# Patient Record
Sex: Male | Born: 1980 | Race: White | Hispanic: Yes | Marital: Married | Smoking: Never smoker
Health system: Southern US, Community
[De-identification: ages and names within clinical notes are randomized; demographics above are authoritative.]

## PROBLEM LIST (undated history)

## (undated) DIAGNOSIS — N411 Chronic prostatitis: Secondary | ICD-10-CM

## (undated) DIAGNOSIS — N209 Urinary calculus, unspecified: Secondary | ICD-10-CM

## (undated) HISTORY — PX: NO PAST SURGERIES: SHX2092

## (undated) HISTORY — DX: Urinary calculus, unspecified: N20.9

---

## 2007-02-05 ENCOUNTER — Emergency Department: Payer: Self-pay | Admitting: Emergency Medicine

## 2013-05-02 DIAGNOSIS — N209 Urinary calculus, unspecified: Secondary | ICD-10-CM

## 2013-05-02 HISTORY — DX: Urinary calculus, unspecified: N20.9

## 2015-09-19 ENCOUNTER — Encounter: Payer: Self-pay | Admitting: *Deleted

## 2015-09-20 ENCOUNTER — Encounter: Payer: Self-pay | Admitting: Obstetrics and Gynecology

## 2015-09-20 ENCOUNTER — Ambulatory Visit (INDEPENDENT_AMBULATORY_CARE_PROVIDER_SITE_OTHER): Payer: BLUE CROSS/BLUE SHIELD | Admitting: Obstetrics and Gynecology

## 2015-09-20 VITALS — BP 120/77 | HR 71 | Resp 16 | Ht 67.0 in | Wt 172.6 lb

## 2015-09-20 DIAGNOSIS — Z87442 Personal history of urinary calculi: Secondary | ICD-10-CM | POA: Diagnosis not present

## 2015-09-20 DIAGNOSIS — N200 Calculus of kidney: Secondary | ICD-10-CM | POA: Diagnosis not present

## 2015-09-20 DIAGNOSIS — N390 Urinary tract infection, site not specified: Secondary | ICD-10-CM | POA: Diagnosis not present

## 2015-09-20 LAB — URINALYSIS, COMPLETE
BILIRUBIN UA: NEGATIVE
GLUCOSE, UA: NEGATIVE
Ketones, UA: NEGATIVE
LEUKOCYTES UA: NEGATIVE
Nitrite, UA: NEGATIVE
PROTEIN UA: NEGATIVE
UUROB: 0.2 mg/dL (ref 0.2–1.0)
pH, UA: 6 (ref 5.0–7.5)

## 2015-09-20 LAB — MICROSCOPIC EXAMINATION
BACTERIA UA: NONE SEEN
Renal Epithel, UA: NONE SEEN /hpf

## 2015-09-20 LAB — BLADDER SCAN AMB NON-IMAGING

## 2015-09-20 NOTE — Patient Instructions (Signed)
Recomendaciones dietticas para prevenir la formacin de clculos renales (Dietary Guidelines to Help Prevent Kidney Stones) El riesgo de clculos renales puede disminuir si modifica los alimentos que consume. Lo ms importante es que beba mucho lquido. Debe beber la suficiente cantidad de lquido para mantener la orina clara o de color amarillo plido. Las siguientes recomendaciones brindan informacin especfica para el tipo de clculo renal que usted tiene: RECOMENDACIONES SEGN EL TIPO DE CLCULO RENAL Clculos renales de oxalato de calcio  Reduzca la cantidad de sal que ingiere. Los alimentos con mucha sal hacen que el cuerpo libere exceso de calcio en la orina. El exceso de calcio se puede combinar con una sustancia llamada oxalato y formar clculos renales.  Reduzca la cantidad de protenas animales que consume si lo hace en exceso. Las protenas animales hacen que el cuerpo libere exceso de calcio en la orina. Pregunte a su nutricionista qu cantidad de protenas provenientes de animales debera comer.  Evite los alimentos con alto contenido de oxalatos. Si toma vitaminas, debe consumir menos de 500 mg de vitaminaC. El cuerpo convierte la vitaminaC en oxalatos. No es necesario que evite las frutas y verduras ricas en vitaminaC. Clculos renales de fosfato de calcio  Reduzca la cantidad de sal que ingiere para ayudar a evitar la liberacin de exceso de calcio en la orina.  Reduzca la cantidad de protenas animales que consume si lo hace en exceso. Las protenas animales hacen que el cuerpo libere exceso de calcio en la orina. Pregunte a su nutricionista qu cantidad de protenas provenientes de animales debera comer.  Consuma calcio a travs de los alimentos o tome un suplemento de calcio (pdale recomendaciones a su nutricionista). Ejemplos de fuentes alimenticias de calcio que no aumentan el riesgo de clculos renales:  Brcoli.  Productos lcteos, como queso y  Dentist.  Pudin. Clculos renales de cido rico  No consuma ms de 6 onzas (170 g) de protenas animales por da. FUENTES ALIMENTICIAS Iris Pert de protenas animales  Carne (todo tipo).  Aves.  Huevos.  Pescado, frutos de mar. Alimentos con alto contenido de sal  Condimentos salados.  Salsa de soja.  Salsa teriyaki.  Carnes curadas y procesadas.  Galletas saladas y bocadillos.  Comida rpida.  Sopas enlatadas y Games developer de los alimentos enlatados. Alimentos con alto contenido de oxalatos  Granos:  Corporate investment banker.  Cebada.  Smola.  Germen de trigo.  Salvado.  Harina de trigo sarraceno.  Todos los cereales de salvado.  Pretzels.  Pan integral.  Verduras:  Frijoles (de vaina amarilla).  Remolachas y hojas de Psychiatric nurse.  Vincent Peyer.  Christella Noa.  Escarola.  Puerro.  Calal.  Perejil.  Colinabos.  Espinaca.  Acelga suiza.  Extracto de [place].  Papas fritas.  Batatas.  Nils PyleBenancio Deeds rojas.  Higos.  Kiwi.  Ruibarbo.  Carne y otras fuentes de protenas:  Frijoles (secos).  Hamburguesas de soja y otros productos de soja.  Miso.  Frutos secos (cacahuetes, almendras, pacanas, castaas, avellanas).  Mantequilla de frutos secos.  Semillas de ssamo y tahini (pasta hecha de semillas de ssamo).  Semillas de amapola.  Bebidas:  Polvo para bebidas de chocolate.  Leche de soja.  T helado instantneo.  Jugos hechos de frutas o verduras con alto contenido de oxalato.  Otros:  Algarroba.  Chocolate.  Torta de frutas.  Mermeladas.   Esta informacin no tiene Theme park manager el consejo del mdico. Asegrese de hacerle al mdico cualquier pregunta que tenga.   Document Released: 04/20/2012 Document Revised: 08/01/2013  Chartered certified accountant Patient Education [employer].

## 2015-09-20 NOTE — Progress Notes (Signed)
09/20/2015 8:58 AM   Douglas Baldwin Vanessa Barbara 09/05/1980 [id number]  Referring provider: No referring provider defined for this encounter.  Chief Complaint  Patient presents with  . Nephrolithiasis  . Establish Care    HPI: Patient is a 35 year old Hispanic male presenting today with a Spanish interpreter as a referral from his primary care provider with complaints of recent urinary tract infection and possible prostatitis.   He reports recent of  urinary frequency, dysuria, subjective fever.  Was prescribed Bactrim twice daily x 40 days.  Symptoms now almost completely resolved.  Baseline nocturia once per night.  No new sexual partners.  Married.   Patient reports that he drinks very little fluid at baseline but has significantly increase his fluid intake which has greatly improved his symptoms.  History of stones.  Last episode 2014.  Passed with medical management.   09/10/15 UC +>100,000 E. Coli  PVR 32ml.  FMH: no prostate cancer or other GU malignancies  PMH: Past Medical History  Diagnosis Date  . Calculus (=stone) 05/02/2013    Surgical History: No past surgical history on file.  Home Medications:    Medication List       This list is accurate as of: 09/20/15 11:59 PM.  Always use your most recent med list.               STOOL SOFTENER PO  Take by mouth.     sulfamethoxazole-trimethoprim 800-160 MG tablet  Commonly known as:  BACTRIM DS,SEPTRA DS  Take 1 tablet by mouth 2 (two) times daily.        Allergies: No Known Allergies  Family History: No family history on file.  Social History:  reports that he has never smoked. He does not have any smokeless tobacco history on file. He reports that he does not drink alcohol or use illicit drugs.  ROS: UROLOGY Frequent Urination?: Yes Hard to postpone urination?: Yes Burning/pain with urination?: Yes Get up at night to urinate?: Yes Leakage of urine?: Yes Urine stream starts and stops?: No Trouble  starting stream?: Yes Do you have to strain to urinate?: No Blood in urine?: No Urinary tract infection?: Yes Sexually transmitted disease?: No Injury to kidneys or bladder?: No Painful intercourse?: No Weak stream?: Yes Erection problems?: No Penile pain?: No  Gastrointestinal Nausea?: No Vomiting?: No Indigestion/heartburn?: Yes Diarrhea?: No Constipation?: Yes  Constitutional Fever: No Night sweats?: No Weight loss?: No Fatigue?: Yes  Skin Skin rash/lesions?: No Itching?: No  Eyes Blurred vision?: No Double vision?: No  Ears/Nose/Throat Sore throat?: No Sinus problems?: No  Hematologic/Lymphatic Swollen glands?: No Easy bruising?: No  Cardiovascular Leg swelling?: No Chest pain?: No  Respiratory Cough?: No Shortness of breath?: No  Endocrine Excessive thirst?: No  Musculoskeletal Back pain?: No Joint pain?: No  Neurological Headaches?: No Dizziness?: No  Psychologic Depression?: No Anxiety?: Yes  Physical Exam: BP 120/77 mmHg  Pulse 71  Resp 16  Ht  (1.702 m)  Wt 172 lb 9.6 oz (78.291 kg)  BMI 27.03 kg/m2  Constitutional:  Alert and oriented, No acute distress. HEENT: [place] AT, moist mucus membranes.  Trachea midline, no masses. Cardiovascular: No clubbing, cyanosis, or edema. Respiratory: Normal respiratory effort, no increased work of breathing. GI: Abdomen is soft, nontender, nondistended, no abdominal masses GU: No CVA tenderness.  Uncircumcised phallus, testicles descended bilaterally without palpable masses DRE: small prostate,mildly tender, smooth, not boggy Skin: No rashes, bruises or suspicious lesions. Lymph: No cervical or inguinal adenopathy. Neurologic: Grossly intact, no  focal deficits, moving all 4 extremities. Psychiatric: Normal mood and affect.  Laboratory Data:  No results found for: WBC, HGB, HCT, MCV, PLT  No results found for: CREATININE  No results found for: PSA  No results found for:  TESTOSTERONE  No results found for: HGBA1C  Urinalysis Results for orders placed or performed in visit on 09/20/15  Microscopic Examination  Result Value Ref Range   WBC, UA 6-10 (A) 0 -  5 /hpf   RBC, UA 11-30 (A) 0 -  2 /hpf   Epithelial Cells (non renal) 0-10 0 - 10 /hpf   Renal Epithel, UA None seen None seen /hpf   Mucus, UA Present (A) Not Estab.   Bacteria, UA None seen None seen/Few  Urinalysis, Complete  Result Value Ref Range   Specific Gravity, UA >1.030 (H) 1.005 - 1.030   pH, UA 6.0 5.0 - 7.5   Color, UA Yellow Yellow   Appearance Ur Clear Clear   Leukocytes, UA Negative Negative   Protein, UA Negative Negative/Trace   Glucose, UA Negative Negative   Ketones, UA Negative Negative   RBC, UA 2+ (A) Negative   Bilirubin, UA Negative Negative   Urobilinogen, Ur 0.2 0.2 - 1.0 mg/dL   Nitrite, UA Negative Negative   Microscopic Examination See below:   BLADDER SCAN AMB NON-IMAGING  Result Value Ref Range   Scan Result 32 mL     Pertinent Imaging:   Assessment & Plan:    1. Urinary tract infection- Complete Bactrim as prescribed by PCP and increased fluid intake. Recheck UA/culture in 1 month. - Urinalysis, Complete - BLADDER SCAN AMB NON-IMAGING  2. H/o kidney stones- -RUS and KUB  3. Prostate Cancer Screening- DRE unremarkable. PSA to be drawn at next visit.  Return in about 1 month (around 10/18/2015) for  1 month for recheck symptoms/PSA; KUB/RUS results.  These notes generated with voice recognition software. I apologize for typographical errors.  Earlie Lou, FNP  [facility] [address], [address] [place], [place] [address] [phone]

## 2015-10-03 ENCOUNTER — Ambulatory Visit
Admission: RE | Admit: 2015-10-03 | Discharge: 2015-10-03 | Disposition: A | Discharge status: Home / Self Care | Payer: BLUE CROSS/BLUE SHIELD | Source: Ambulatory Visit | Attending: Obstetrics and Gynecology | Admitting: Obstetrics and Gynecology

## 2015-10-03 DIAGNOSIS — N390 Urinary tract infection, site not specified: Secondary | ICD-10-CM | POA: Diagnosis not present

## 2015-10-18 ENCOUNTER — Ambulatory Visit (INDEPENDENT_AMBULATORY_CARE_PROVIDER_SITE_OTHER): Payer: BLUE CROSS/BLUE SHIELD | Admitting: Obstetrics and Gynecology

## 2015-10-18 ENCOUNTER — Encounter: Payer: Self-pay | Admitting: Obstetrics and Gynecology

## 2015-10-18 VITALS — BP 113/74 | HR 69 | Resp 16 | Ht 67.0 in | Wt 171.8 lb

## 2015-10-18 DIAGNOSIS — N390 Urinary tract infection, site not specified: Secondary | ICD-10-CM | POA: Diagnosis not present

## 2015-10-18 DIAGNOSIS — Z125 Encounter for screening for malignant neoplasm of prostate: Secondary | ICD-10-CM | POA: Diagnosis not present

## 2015-10-18 DIAGNOSIS — R3129 Other microscopic hematuria: Secondary | ICD-10-CM

## 2015-10-18 NOTE — Progress Notes (Signed)
3:46 PM   Douglas Baldwin [facility] May 16, 1981 [id number]  Referring provider: No referring provider defined for this encounter.  Chief Complaint  Patient presents with  . Urinary Tract Infection  . Follow-up    HPI: Patient is a 35 year old Hispanic male presenting today with a Spanish interpreter as a referral from his primary care provider with complaints of recent urinary tract infection and possible prostatitis.   He reports recent of  urinary frequency, dysuria, subjective fever.  Was prescribed Bactrim twice daily x 40 days.  Symptoms now almost completely resolved.  Baseline nocturia once per night.  No new sexual partners.  Married.   Patient reports that he drinks very little fluid at baseline but has significantly increase his fluid intake which has greatly improved his symptoms.  History of stones.  Last episode 2014.  Passed with medical management.   09/10/15 UC +>100,000 E. Coli  PVR 32ml.  FMH: no prostate cancer or other GU malignancies  Interval History 10/18/15:  Presents to review RUS results.   Symptoms much improved.  No urinary symptoms at this time. Never smoker. He is a Education administrator.  PMH: Past Medical History  Diagnosis Date  . Calculus (=stone) 05/02/2013    Surgical History: History reviewed. No pertinent past surgical history.  Home Medications:    Medication List       This list is accurate as of: 10/18/15  3:46 PM.  Always use your most recent med list.               STOOL SOFTENER PO  Take by mouth.     sulfamethoxazole-trimethoprim 800-160 MG tablet  Commonly known as:  BACTRIM DS,SEPTRA DS  Take 1 tablet by mouth 2 (two) times daily.        Allergies: No Known Allergies  Family History: History reviewed. No pertinent family history.  Social History:  reports that he has never smoked. He does not have any smokeless tobacco history on file. He reports that he does not drink alcohol or use illicit  drugs.  ROS: UROLOGY Frequent Urination?: No Hard to postpone urination?: No Burning/pain with urination?: No Get up at night to urinate?: No Leakage of urine?: Yes Urine stream starts and stops?: No Trouble starting stream?: No Do you have to strain to urinate?: No Blood in urine?: No Urinary tract infection?: No Sexually transmitted disease?: No Injury to kidneys or bladder?: No Painful intercourse?: No Weak stream?: No Erection problems?: No Penile pain?: No  Gastrointestinal Nausea?: No Vomiting?: No Indigestion/heartburn?: No Diarrhea?: No Constipation?: No  Constitutional Fever: No Night sweats?: No Weight loss?: No Fatigue?: Yes  Skin Skin rash/lesions?: No Itching?: No  Eyes Blurred vision?: No Double vision?: No  Ears/Nose/Throat Sore throat?: No Sinus problems?: No  Hematologic/Lymphatic Swollen glands?: No Easy bruising?: No  Cardiovascular Leg swelling?: No Chest pain?: No  Respiratory Cough?: No Shortness of breath?: No  Endocrine Excessive thirst?: No  Musculoskeletal Back pain?: No Joint pain?: No  Neurological Headaches?: No Dizziness?: No  Psychologic Depression?: No Anxiety?: No  Physical Exam: BP 113/74 mmHg  Pulse 69  Resp 16  Ht  (1.702 m)  Wt 171 lb 12.8 oz (77.928 kg)  BMI 26.90 kg/m2  Constitutional:  Alert and oriented, No acute distress. HEENT: [place] AT, moist mucus membranes.  Trachea midline, no masses. Cardiovascular: No clubbing, cyanosis, or edema. Respiratory: Normal respiratory effort, no increased work of breathing. GI: Abdomen is soft, nontender, nondistended, no abdominal masses GU: No CVA tenderness.  Uncircumcised  phallus, testicles descended bilaterally without palpable masses DRE: small prostate,mildly tender, smooth, not boggy Skin: No rashes, bruises or suspicious lesions. Lymph: No cervical or inguinal adenopathy. Neurologic: Grossly intact, no focal deficits, moving all 4  extremities. Psychiatric: Normal mood and affect.  Laboratory Data:  No results found for: WBC, HGB, HCT, MCV, PLT  No results found for: CREATININE  No results found for: PSA  No results found for: TESTOSTERONE  No results found for: HGBA1C  Urinalysis Results for orders placed or performed in visit on 09/20/15  Microscopic Examination  Result Value Ref Range   WBC, UA 6-10 (A) 0 -  5 /hpf   RBC, UA 11-30 (A) 0 -  2 /hpf   Epithelial Cells (non renal) 0-10 0 - 10 /hpf   Renal Epithel, UA None seen None seen /hpf   Mucus, UA Present (A) Not Estab.   Bacteria, UA None seen None seen/Few  Urinalysis, Complete  Result Value Ref Range   Specific Gravity, UA >1.030 (H) 1.005 - 1.030   pH, UA 6.0 5.0 - 7.5   Color, UA Yellow Yellow   Appearance Ur Clear Clear   Leukocytes, UA Negative Negative   Protein, UA Negative Negative/Trace   Glucose, UA Negative Negative   Ketones, UA Negative Negative   RBC, UA 2+ (A) Negative   Bilirubin, UA Negative Negative   Urobilinogen, Ur 0.2 0.2 - 1.0 mg/dL   Nitrite, UA Negative Negative   Microscopic Examination See below:   BLADDER SCAN AMB NON-IMAGING  Result Value Ref Range   Scan Result 32 mL     Pertinent Imaging:   Assessment & Plan:    1. Urinary tract infection- Completed Bactrim and urinary symptoms resolved.   - Urinalysis, Complete - BLADDER SCAN AMB NON-IMAGING  2. Microscopic Hematuria- Continued microscopic hematuria on UA today.  We discussed the differential diagnosis for microscopic hematuria including nephrolithiasis, renal or upper tract tumors, bladder stones, UTIs, or bladder tumors as well as undetermined etiologies. Per AUA guidelines, I did recommend complete microscopic hematuria evaluation including CTU, possible urine cytology, and office cystoscopy. -CT Urogram -cystoscopy  3. H/o kidney stones- -RUS negative  4. Prostate Cancer Screening- DRE unremarkable. PSA to be drawn.   Return for Ct  Urogram results and cystoscopy.  These notes generated with voice recognition software. I apologize for typographical errors.  Douglas LouLindsay Jashon Ishida, FNP  [facility] [address], [address] [place], [place] [id number] [phone]

## 2015-10-18 NOTE — Patient Instructions (Signed)
Hematuria - Adultos (Hematuria, Adult) La hematuria es la presencia de sangre en la orina. La causa puede ser una infeccin en la vejiga, en los riones, en la prstata, clculos renales o cncer de las vas urinarias. Normalmente las infecciones pueden tratarse con medicamentos, y los clculos renales, por lo general, se eliminarn a travs de la orina. Si ninguna de estas es la causa de su hematuria, quizs se necesiten ms estudios para Building services engineer. Es muy importante que le informe a su mdico si ve sangre en la [place], aunque el sangrado se detenga sin tratamiento o no cause dolor. La sangre que aparece en la orina y luego se detiene y vuelve a aparecer nuevamente puede ser un sntoma de una enfermedad muy grave. Adems, el dolor no es un sntoma en las etapas iniciales de muchos tipos de cncer urinarios. INSTRUCCIONES PARA EL CUIDADO EN EL HOGAR   Beba mucho lquido, entre 3 a Risk analyst. Si le han diagnosticado una infeccin, se recomienda especialmente el jugo de arndanos rojos, [address] de grandes cantidades de [place].  Evite la cafena, el t y las bebidas con gas porque suelen irritar la vejiga.  Evite el alcohol ya que puede Teacher, adult education.  Tome todos los [employer] se lo haya indicado el mdico.  Si le recetaron antibiticos, asegrese de terminarlos, incluso si comienza a sentirse mejor.  Si le han diagnosticado clculos renales, siga las instrucciones de su mdico con respecto a Ecologist la orina para TEFL teacher clculo.  Vace la vejiga con frecuencia. Evite retener la orina durante largos perodos.  Despus de defecar, las mujeres deben higienizarse desde adelante hacia atrs. Use solo un papel por vez.  Si es [employer], vace la vejiga antes y despus de Management consultant. SOLICITE ATENCIN MDICA SI:  Siente dolor en la espalda.  Tiene fiebre.  Tiene sensacin de Programme researcher, broadcasting/film/video (nuseas) o vmitos.  Los sntomas no mejoran despus  de [address]. Si la condicin empeora, visite al mdico antes de lo previsto. SOLICITE ATENCIN MDICA DE INMEDIATO SI:   Vomita con frecuencia e intensamente y no puede tolerar los medicamentos.  Siente un dolor intenso en la espalda o el abdomen incluso tomando los medicamentos.  Elimina cogulos grandes o sangre con la [place].  Se siente extremadamente dbil, se desmaya o pierde el conocimiento. ASEGRESE DE QUE:   Comprende estas instrucciones.  Controlar su afeccin.  Recibir ayuda de inmediato si no mejora o si empeora.   Esta informacin no tiene Theme park manager el consejo del mdico. Asegrese de hacerle al mdico cualquier pregunta que tenga.   Document Released: 07/27/2005 Document Revised: 08/17/2014 Elsevier Interactive Patient Education 2016 [employer]. Clculos renales (Kidney Stones) Los clculos renales (urolitiasis) son masas slidas que se forman en el interior de los riones. El dolor intenso es causado por el movimiento de la piedra a travs del tracto urinario. Cuando la piedra se mueve, el urter hace un espasmo alrededor de la misma. El clculo generalmente se elimina con la [place].  CAUSAS   Un trastorno que hace que ciertas glndulas del cuello produzcan demasiada hormona paratiroidea (hiperparatiroidismo primario).  Una acumulacin de cristales de cido rico, similar a Brewing technologist.  Estrechamiento (constriccin) del urter.  Obstruccin en el rin presente al nacer (obstruccin congnita).  Cirugas previas del rin o los urteres.  Numerosas infecciones renales. SNTOMAS   Ganas de vomitar (nuseas).  Devolver la comida (vomitar).  Sangre en la orina (hematuria).  Dolor que generalmente se expande (irradia) hacia la ingle.  Ganas de orinar con frecuencia o de manera urgente. DIAGNSTICO   Historia clnica y examen fsico.  Anlisis de sangre y [place].  Tomografa computada.  En algunos casos se realiza un  examen del interior de la vejiga (citoscopa). TRATAMIENTO   Observacin.  Aumentar la ingesta de lquidos.  Litotricia extracorprea con ondas de choque: es un procedimiento no invasivo que [place] ondas de choque para romper los clculos renales.  Ser necesaria la ciruga si tiene dolor muy intenso o la obstruccin persiste. Hay varios procedimientos quirrgicos. La mayora de los procedimientos se realizan con el uso de pequeos instrumentos. Slo es Passenger transport manager pequeas incisiones para acomodar estos instrumentos, por lo tanto el tiempo de recuperacin es mnimo. El tamao, la ubicacin y la composicin qumica de los clculos son variables importantes que determinarn la eleccin correcta de tratamiento para su caso. Comunquese con su mdico para comprender mejor su situacin, de modo que pueda [employer] de lesiones para usted y su rin.  INSTRUCCIONES PARA EL CUIDADO EN EL HOGAR   Beba gran cantidad de lquido para mantener la orina de tono claro o color amarillo plido. Esto ayudar a eliminar las piedras o los fragmentos.  Cuele la orina con el colador que le han provisto. Guarde todas las partculas y piedras para que las vea el profesional que lo asiste. Puede ser tan pequea como un grano de sal. Es muy importante usar el colador cada vez que orine. La recoleccin de piedras permitir al [employer] y Investment banker, operational que efectivamente ha eliminado una piedra. El anlisis de la piedra con frecuencia permitir identificar qu puede hacer para reducir la incidencia de las recurrencias.  Slo tome medicamentos de venta libre o recetados para Primary school teacher, Environmental health practitioner o bajar la fiebre, segn las indicaciones de su mdico.  Oceanographer a todas las visitas de control como se lo haya indicado el mdico. Esto es importante.  Si se lo indica, hgase radiografas. La ausencia de dolor no siempre significa que las piedras se han eliminado. Puede ser que simplemente hayan  dejado de moverse. Si el paso de orina permanece completamente obstruido, puede causar prdida de la funcin renal o simplemente la destruccin del rin. Es su responsabilidad Chartered certified accountant seguimiento y las radiografas. Las ecografas del rin pueden mostrar una obstruccin y el estado del rin. Las ecografas no se asocian con la radiacin y pueden realizarse fcilmente en cuestin de minutos.  Haga cambios en la dieta diaria como se lo haya indicado el mdico. Es posible que le indiquen lo siguiente:  Limitar la cantidad de sal que consume.  Consumir 5 o ms porciones de frutas y Warehouse manager.  Limitar la cantidad de carne, carne de ave, pescado y [employer] consume.  Recoger Lauris Poag de orina durante 24 horas como se lo haya indicado el mdico. Tal vez tenga que recoger otra muestra de orina cada 6 o 12 meses. SOLICITE ATENCIN MDICA SI:  Siente dolor que no responde a los Public affairs consultant. SOLICITE ATENCIN MDICA DE INMEDIATO SI:   No puede controlar el dolor con los medicamentos que le han recetado.  Siente escalofros o fiebre.  La gravedad o la intensidad del dolor aumenta durante 18 horas y no se Chief Executive Officer con los analgsicos.  Presenta un nuevo episodio de dolor abdominal.  Sufre mareos o se desmaya.  No puede orinar.   Esta informacin no tiene Theme park manager el consejo  del mdico. Asegrese de hacerle al mdico cualquier pregunta que tenga.   Document Released: 07/27/2005 Document Revised: 04/17/2015 Elsevier Interactive Patient Education [employer]2016 Elsevier Inc.

## 2015-10-19 LAB — MICROSCOPIC EXAMINATION
BACTERIA UA: NONE SEEN
EPITHELIAL CELLS (NON RENAL): NONE SEEN /HPF (ref 0–10)

## 2015-10-19 LAB — URINALYSIS, COMPLETE
BILIRUBIN UA: NEGATIVE
Glucose, UA: NEGATIVE
Ketones, UA: NEGATIVE
LEUKOCYTES UA: NEGATIVE
Nitrite, UA: NEGATIVE
PH UA: 7 (ref 5.0–7.5)
Protein, UA: NEGATIVE
Specific Gravity, UA: 1.025 (ref 1.005–1.030)
Urobilinogen, Ur: 0.2 mg/dL (ref 0.2–1.0)

## 2015-10-19 LAB — BASIC METABOLIC PANEL
BUN/Creatinine Ratio: 18 (ref 8–19)
BUN: 15 mg/dL (ref 6–20)
CALCIUM: 9.1 mg/dL (ref 8.7–10.2)
CHLORIDE: 100 mmol/L (ref 96–106)
CO2: 20 mmol/L (ref 18–29)
CREATININE: 0.84 mg/dL (ref 0.76–1.27)
GFR calc non Af Amer: 113 mL/min/{1.73_m2} (ref >59)
GFR, EST AFRICAN AMERICAN: 131 mL/min/{1.73_m2} (ref >59)
GLUCOSE: 74 mg/dL (ref 65–99)
Potassium: 4.1 mmol/L (ref 3.5–5.2)
Sodium: 140 mmol/L (ref 134–144)

## 2015-10-19 LAB — PSA: Prostate Specific Ag, Serum: 0.4 ng/mL (ref 0.0–4.0)

## 2015-10-21 ENCOUNTER — Telehealth: Payer: Self-pay

## 2015-10-21 NOTE — Telephone Encounter (Signed)
Spoke with pt in reference to lab results. Pt voiced understanding.  

## 2015-10-21 NOTE — Telephone Encounter (Signed)
-----   Message from Fernanda DrumLindsay C Overton, FNP sent at 10/21/2015  8:33 AM EDT ----- Please notify patient that his PSA was within normal range. His kidney function was also good. Thanks

## 2015-10-23 ENCOUNTER — Encounter: Payer: Self-pay | Admitting: Urology

## 2015-10-23 ENCOUNTER — Encounter: Payer: Self-pay | Admitting: Obstetrics and Gynecology

## 2015-10-23 ENCOUNTER — Ambulatory Visit (INDEPENDENT_AMBULATORY_CARE_PROVIDER_SITE_OTHER): Payer: BLUE CROSS/BLUE SHIELD | Admitting: Urology

## 2015-10-23 VITALS — BP 148/89 | HR 101 | Ht 70.0 in | Wt 175.0 lb

## 2015-10-23 DIAGNOSIS — N411 Chronic prostatitis: Secondary | ICD-10-CM

## 2015-10-23 DIAGNOSIS — R300 Dysuria: Secondary | ICD-10-CM | POA: Diagnosis not present

## 2015-10-23 DIAGNOSIS — Z87442 Personal history of urinary calculi: Secondary | ICD-10-CM | POA: Diagnosis not present

## 2015-10-23 DIAGNOSIS — R3129 Other microscopic hematuria: Secondary | ICD-10-CM

## 2015-10-23 LAB — URINALYSIS, COMPLETE
Bilirubin, UA: NEGATIVE
GLUCOSE, UA: NEGATIVE
Ketones, UA: NEGATIVE
LEUKOCYTES UA: NEGATIVE
Nitrite, UA: NEGATIVE
PROTEIN UA: NEGATIVE
Specific Gravity, UA: <1.005 — ABNORMAL LOW (ref 1.005–1.030)
UUROB: 0.2 mg/dL (ref 0.2–1.0)
pH, UA: 6.5 (ref 5.0–7.5)

## 2015-10-23 LAB — MICROSCOPIC EXAMINATION
BACTERIA UA: NONE SEEN
Epithelial Cells (non renal): NONE SEEN /hpf (ref 0–10)
WBC UA: NONE SEEN /HPF (ref 0–?)

## 2015-10-23 MED ORDER — LIDOCAINE HCL 2 % EX GEL
1.0000 "application " | Freq: Once | CUTANEOUS | Status: AC
  Administered 2015-10-23: 1 via URETHRAL

## 2015-10-23 MED ORDER — OXYBUTYNIN CHLORIDE 5 MG PO TABS: 5.0000 mg | ORAL_TABLET | Freq: Three times a day (TID) | ORAL | Status: DC | PRN

## 2015-10-23 MED ORDER — IBUPROFEN 800 MG PO TABS: 800.0000 mg | ORAL_TABLET | Freq: Three times a day (TID) | ORAL | Status: DC

## 2015-10-23 NOTE — Progress Notes (Signed)
5:21 PM  10/28/2015  Douglas Baldwin 1981/02/15 [id number]  Referring provider: [facility] [address] [place], [place] [address]  Chief Complaint  Patient presents with  . Cysto    HPI: 35 year old Hispanic male presenting today with a Spanish interpreter as a referral from his primary care provider with complaints of recent urinary tract infection/ prostatitis.  He did have a + UCx on 09/10/15 UC +>100,000 E. Coli.  At that time, he had signficant  urinary frequency, dysuria, subjective fever.  He was prescribed Bactrim twice daily x 40 days.    He improved initially and then the symptoms this time including abdominal pain, urinary symptoms.  He felt that his Azo and Bactrim were making things worse so ultimately stopped these medications.    He presented to the emergency room on 10/20/2015 at [facility] with lower abdominal pain, nausea and vomiting x 2 days at which time CT abdomen pelvis without contrast was performed. This revealed no evidence of stones or any acute CT findings.  He was prescribed additional doxycycline  X 21 days.  Again, his pain is improved with this treatment. He is also taking Flomax for his urinary symptoms.  He has also been noted to have microscopic hematuria on several occasions. He returns to the office today for office cystoscopy. Given that he had a recent noncontrast CT scan, I have offered him to proceed to the operating room for cystoscopy, bilateral retrograde pyelogram to complete his workup. Alternatively, we discussed that given his relatively young age and lack of smoking history, there is very low risk for any malignancy or pathology and is collecting system or ureters. As such, we could defer CT urogram. The patient is very anxious and would like to go ahead and have this CT scan for completeness.   PMH: Past Medical History  Diagnosis Date  . Calculus (=stone) 05/02/2013    Surgical History: History reviewed. No pertinent  past surgical history.  Home Medications:    Medication List       This list is accurate as of: 10/23/15 11:59 PM.  Always use your most recent med list.               doxycycline 100 MG capsule  Commonly known as:  VIBRAMYCIN  Take 100 mg by mouth.     ibuprofen 800 MG tablet  Commonly known as:  ADVIL,MOTRIN  Take 1 tablet (800 mg total) by mouth 3 (three) times daily.     oxybutynin 5 MG tablet  Commonly known as:  DITROPAN  Take 1 tablet (5 mg total) by mouth every 8 (eight) hours as needed for bladder spasms. Urgency/ frequency symptoms     STOOL SOFTENER PO  Take by mouth.        Allergies: No Known Allergies  Family History: History reviewed. No pertinent family history.  Social History:  reports that he has never smoked. He does not have any smokeless tobacco history on file. He reports that he does not drink alcohol or use illicit drugs.  Physical Exam: BP 148/89 mmHg  Pulse 101  Ht  (1.778 m)  Wt 175 lb (79.379 kg)  BMI 25.11 kg/m2  Constitutional:  Alert and oriented, No acute distress. HEENT: [place] AT, moist mucus membranes.  Trachea midline, no masses. Cardiovascular: No clubbing, cyanosis, or edema. Respiratory: Normal respiratory effort, no increased work of breathing. GI: Abdomen is soft, nontender, nondistended, no abdominal masses GU: No CVA tenderness.  Uncircumcised phallus, testicles  descended bilaterally without palpable masses Skin: No rashes, bruises or suspicious lesions. Neurologic: Grossly intact, no focal deficits, moving all 4 extremities. Psychiatric: Normal mood and affect.  Laboratory Data:  Lab Results  Component Value Date   CREATININE 0.84 10/18/2015   Component     Latest Ref Rng 10/18/2015  PSA     0.0 - 4.0 ng/mL 0.4    Urinalysis Results for orders placed or performed in visit on 10/23/15  Microscopic Examination  Result Value Ref Range   WBC, UA None seen 0 -  5 /hpf   RBC, UA 0-2 0 -  2 /hpf    Epithelial Cells (non renal) None seen 0 - 10 /hpf   Bacteria, UA None seen None seen/Few  Urinalysis, Complete  Result Value Ref Range   Specific Gravity, UA <1.005 (L) 1.005 - 1.030   pH, UA 6.5 5.0 - 7.5   Color, UA Yellow Yellow   Appearance Ur Clear Clear   Leukocytes, UA Negative Negative   Protein, UA Negative Negative/Trace   Glucose, UA Negative Negative   Ketones, UA Negative Negative   RBC, UA Trace (A) Negative   Bilirubin, UA Negative Negative   Urobilinogen, Ur 0.2 0.2 - 1.0 mg/dL   Nitrite, UA Negative Negative   Microscopic Examination See below:     Pertinent Imaging:  [facility] ( see care everywhere)     Cystoscopy Procedure Note  Patient identification was confirmed, informed consent was obtained, and patient was prepped using Betadine solution.  Lidocaine jelly was administered per urethral meatus.    Preoperative abx where received prior to procedure.     Pre-Procedure: - Inspection reveals a normal caliber ureteral meatus.  Procedure: The flexible cystoscope was introduced without difficulty - No urethral strictures/lesions are present. - Normal prostate  - Normal bladder neck - Bilateral ureteral orifices identified - Bladder mucosa  reveals no ulcers, tumors, or lesions - No bladder stones - No trabeculation  Retroflexion unremarkable   Post-Procedure: - Patient tolerated the procedure well  Assessment & Plan:   1. Microscopic hematuria S/p negative cystoscopy today Post procedure instructions reviewed Patient would like to proceed with CT urogram for completeness, we will call with results - Urinalysis, Complete - lidocaine (XYLOCAINE) 2 % jelly 1 application; Place 1 application into the urethra once.  2. Dysuria Continue doxycycline to complete course Recommend adequate intake of water Continue Flomax Recommend short course of NSAIDs for inflammation  3. Chronic prostatitis As above  4.  History of kidney stones No stones on CT scan   Return for will call with CT urogram results, f/u PRN.   Vanna ScotlandAshley Ladell Bey, MD  [facility] [address], [address] [place], [place] [address] [phone]

## 2015-10-29 ENCOUNTER — Encounter: Payer: Self-pay | Admitting: Urology

## 2015-10-29 ENCOUNTER — Telehealth: Payer: Self-pay

## 2015-10-29 ENCOUNTER — Ambulatory Visit
Admission: RE | Admit: 2015-10-29 | Discharge: 2015-10-29 | Disposition: A | Discharge status: Home / Self Care | Payer: BLUE CROSS/BLUE SHIELD | Source: Ambulatory Visit | Attending: Obstetrics and Gynecology | Admitting: Obstetrics and Gynecology

## 2015-10-29 ENCOUNTER — Ambulatory Visit (INDEPENDENT_AMBULATORY_CARE_PROVIDER_SITE_OTHER): Payer: BLUE CROSS/BLUE SHIELD | Admitting: Urology

## 2015-10-29 VITALS — BP 129/81 | HR 99 | Ht 66.0 in | Wt 171.0 lb

## 2015-10-29 DIAGNOSIS — N390 Urinary tract infection, site not specified: Secondary | ICD-10-CM | POA: Diagnosis not present

## 2015-10-29 DIAGNOSIS — R3129 Other microscopic hematuria: Secondary | ICD-10-CM | POA: Diagnosis present

## 2015-10-29 DIAGNOSIS — N400 Enlarged prostate without lower urinary tract symptoms: Secondary | ICD-10-CM | POA: Diagnosis not present

## 2015-10-29 DIAGNOSIS — N281 Cyst of kidney, acquired: Secondary | ICD-10-CM | POA: Insufficient documentation

## 2015-10-29 DIAGNOSIS — N3289 Other specified disorders of bladder: Secondary | ICD-10-CM

## 2015-10-29 DIAGNOSIS — N411 Chronic prostatitis: Secondary | ICD-10-CM

## 2015-10-29 MED ORDER — IOPAMIDOL (ISOVUE-300) INJECTION 61%
150.0000 mL | Freq: Once | INTRAVENOUS | Status: AC | PRN
  Administered 2015-10-29: 150 mL via INTRAVENOUS

## 2015-10-29 MED ORDER — OXYBUTYNIN CHLORIDE 5 MG PO TABS: 5.0000 mg | ORAL_TABLET | Freq: Three times a day (TID) | ORAL | Status: DC | PRN

## 2015-10-29 NOTE — Telephone Encounter (Signed)
Medication printed instead of being sent to pharmacy. Medication was e-rx today.

## 2015-10-29 NOTE — Progress Notes (Signed)
10/29/2015   CC: f/u CT urogram  HPI: 35 year old male with a history of Escherichia coli urinary tract infection, prostatitis, and persistent microscopic hematuria. He returned back to the office last week for cystoscopy. He underwent CT urogram today which was essentially normal. Due length and cultural barriers, he returns to the office today to review his CT results and person with a translator.  Study Result     CLINICAL DATA: Microscopic hematuria. Recent urinary tract infection and prostatitis treated with antibiotic therapy.  EXAM: CT ABDOMEN AND PELVIS WITHOUT AND WITH CONTRAST  TECHNIQUE: Multidetector CT imaging of the abdomen and pelvis was performed following the standard protocol before and following the bolus administration of intravenous contrast.  CONTRAST: 150mL ISOVUE-300 IOPAMIDOL (ISOVUE-300) INJECTION 61%  COMPARISON: 02/05/2007 unenhanced CT abdomen/ pelvis. 10/03/2015 renal sonogram.  FINDINGS: Lower chest: No significant pulmonary nodules or acute consolidative airspace disease.  Hepatobiliary: Normal liver with no liver mass. Normal gallbladder with no radiopaque cholelithiasis. No biliary ductal dilatation.  Pancreas: Normal, with no mass or duct dilation.  Spleen: Normal size. No mass.  Adrenals/Urinary Tract: Normal adrenals. No renal stones. No hydronephrosis. Normal caliber ureters, with no ureteral stones. No bladder stones. Normal size kidneys. Solitary hypodense subcentimeter renal cortical lesion in the medial interpolar left kidney, too small to characterize. No additional renal cortical lesions. On delayed imaging, there is no urothelial wall thickening and there are no filling defects in the opacified portions of the bilateral collecting systems or ureters. No bladder wall thickening or focal bladder mass .  Stomach/Bowel: Grossly normal stomach. Normal caliber small bowel with no small bowel wall thickening. Normal  appendix. Normal large bowel with no diverticulosis, large bowel wall thickening or pericolonic fat stranding.  Vascular/Lymphatic: Normal caliber abdominal aorta . Patent portal, splenic, hepatic and renal veins. No pathologically enlarged lymph nodes in the abdomen or pelvis.  Reproductive: Mildly enlarged prostate with nonspecific internal prostatic calcifications.  Other: No pneumoperitoneum, ascites or focal fluid collection.  Musculoskeletal: No aggressive appearing focal osseous lesions.  IMPRESSION: 1. No urolithiasis. No evidence of urinary tract obstruction. 2. Solitary subcentimeter hypodense renal cortical lesion in the left kidney, too small to characterize, probably a benign renal cyst. No overtly suspicious renal cortical masses. No urothelial lesions. 3. Mild prostatomegaly.   Electronically Signed  By: Delbert PhenixJason A Poff M.D.  On: 10/29/2015 11:53     A/P:  1. Microscopic hematuria Status post negative cystoscopy, CT urogram. Results reviewed extensively with patient today. All of his questions were answered. No further workup at this time.  2. Chronic prostatitis Complete course of doxycycline, NSAIDs, Flomax as needed. Patient will return as needed for worsening or persistent symptoms.

## 2015-11-05 ENCOUNTER — Other Ambulatory Visit: Payer: BLUE CROSS/BLUE SHIELD | Admitting: Urology

## 2016-05-14 ENCOUNTER — Encounter: Payer: Self-pay | Admitting: Urology

## 2016-05-14 ENCOUNTER — Ambulatory Visit (INDEPENDENT_AMBULATORY_CARE_PROVIDER_SITE_OTHER): Payer: BLUE CROSS/BLUE SHIELD | Admitting: Urology

## 2016-05-14 VITALS — BP 111/75 | HR 71 | Ht 69.0 in | Wt 175.9 lb

## 2016-05-14 DIAGNOSIS — N411 Chronic prostatitis: Secondary | ICD-10-CM

## 2016-05-14 DIAGNOSIS — R3129 Other microscopic hematuria: Secondary | ICD-10-CM | POA: Diagnosis not present

## 2016-05-14 LAB — URINALYSIS, COMPLETE
Bilirubin, UA: NEGATIVE
GLUCOSE, UA: NEGATIVE
Ketones, UA: NEGATIVE
LEUKOCYTES UA: NEGATIVE
NITRITE UA: NEGATIVE
PROTEIN UA: NEGATIVE
SPEC GRAV UA: 1.02 (ref 1.005–1.030)
Urobilinogen, Ur: 0.2 mg/dL (ref 0.2–1.0)
pH, UA: 7 (ref 5.0–7.5)

## 2016-05-14 LAB — MICROSCOPIC EXAMINATION: Bacteria, UA: NONE SEEN

## 2016-05-14 MED ORDER — MIRABEGRON ER 25 MG PO TB24: 25.0000 mg | ORAL_TABLET | Freq: Every day | ORAL | 11 refills | Status: DC

## 2016-05-14 NOTE — Progress Notes (Signed)
05/14/2016 11:18 AM   Douglas Baldwin [birth year] [id number]  Referring provider: [facility] [address] [place], [place] [address]  Chief Complaint  Patient presents with  . Dysuria    x 1.5 week     HPI: The patient is a 35 year old male with a history of Escherichia coli urinary tract infection, prostatitis, and persistent microscopic hematuria.   He underwent a hematuria workup that was negative in March 2017. He has been diagnosed with chronic prostatitis and has had 3 one-month courses of antibiotics over the last 6 or so months. He continues to have intermittent symptoms that are making day-to-day activities difficult. His biggest complaint is severe urgency as well as urinary frequency. He also reports dysuria. Symptoms are slightly better today however the last few weeks she has been miserable. He has been treated with both rounds of doxycycline and ciprofloxacin with no improvement. He feels that he empties his bladder.   PMH: Past Medical History:  Diagnosis Date  . Calculus (=stone) 05/02/2013    Surgical History: No past surgical history on file.  Home Medications:    Medication List       Accurate as of 05/14/16 11:18 AM. Always use your most recent med list.          ibuprofen 800 MG tablet Commonly known as:  ADVIL,MOTRIN Take 1 tablet (800 mg total) by mouth 3 (three) times daily.   mirabegron ER 25 MG Tb24 tablet Commonly known as:  MYRBETRIQ Take 1 tablet (25 mg total) by mouth daily.   naproxen 500 MG 24 hr tablet Commonly known as:  NAPRELAN Take 500 mg by mouth 2 (two) times daily after a meal.   ondansetron 4 MG disintegrating tablet Commonly known as:  ZOFRAN-ODT Take 4 mg by mouth every 8 (eight) hours as needed for nausea or vomiting.   oxybutynin 5 MG tablet Commonly known as:  DITROPAN Take 1 tablet (5 mg total) by mouth every 8 (eight) hours as needed for bladder spasms. Urgency/ frequency symptoms        Allergies: No Known Allergies  Family History: No family history on file.  Social History:  reports that he has never smoked. He does not have any smokeless tobacco history on file. He reports that he does not drink alcohol or use drugs.  ROS: UROLOGY Frequent Urination?: Yes Hard to postpone urination?: Yes Burning/pain with urination?: Yes Get up at night to urinate?: Yes Leakage of urine?: No Urine stream starts and stops?: Yes Trouble starting stream?: Yes Do you have to strain to urinate?: Yes Blood in urine?: No Urinary tract infection?: No Sexually transmitted disease?: No Injury to kidneys or bladder?: No Painful intercourse?: No Weak stream?: No Erection problems?: No Penile pain?: No  Gastrointestinal Nausea?: No Vomiting?: No Indigestion/heartburn?: No Diarrhea?: No Constipation?: No  Constitutional Fever: No Night sweats?: No Weight loss?: No Fatigue?: No  Skin Skin rash/lesions?: No Itching?: No  Eyes Blurred vision?: No Double vision?: No  Ears/Nose/Throat Sore throat?: No Sinus problems?: No  Hematologic/Lymphatic Swollen glands?: No Easy bruising?: No  Cardiovascular Leg swelling?: No Chest pain?: No  Respiratory Cough?: No Shortness of breath?: No  Endocrine Excessive thirst?: No  Musculoskeletal Back pain?: No Joint pain?: No  Neurological Headaches?: No Dizziness?: No  Psychologic Depression?: No Anxiety?: No  Physical Exam: BP 111/75   Pulse 71   Ht 5\' 9"  (1.753 m)   Wt 175 lb 14.4 oz (79.8 kg)   BMI 25.98 kg/m  Constitutional:  Alert and oriented, No acute distress. HEENT: [place] AT, moist mucus membranes.  Trachea midline, no masses. Cardiovascular: No clubbing, cyanosis, or edema. Respiratory: Normal respiratory effort, no increased work of breathing. GI: Abdomen is soft, nontender, nondistended, no abdominal masses GU: No CVA tenderness. Normal phallus. Testicles descended bilaterally nontender to  palpation. DRE 1+ benign. No bogginess. Skin: No rashes, bruises or suspicious lesions. Lymph: No cervical or inguinal adenopathy. Neurologic: Grossly intact, no focal deficits, moving all 4 extremities. Psychiatric: Normal mood and affect.  Laboratory Data: No results found for: WBC, HGB, HCT, MCV, PLT  Lab Results  Component Value Date   CREATININE 0.84 10/18/2015    No results found for: PSA  No results found for: TESTOSTERONE  No results found for: HGBA1C  Urinalysis    Component Value Date/Time   APPEARANCEUR Clear 10/23/2015 1402   GLUCOSEU Negative 10/23/2015 1402   BILIRUBINUR Negative 10/23/2015 1402   PROTEINUR Negative 10/23/2015 1402   NITRITE Negative 10/23/2015 1402   LEUKOCYTESUR Negative 10/23/2015 1402     Assessment & Plan:   1. Microscopic hematuria -negative work up in March 2017  2. Chronic prostatitis The patient is on 3 courses of antibiotics with no improvement in symptoms. We will start him on a trial of Myrbetriq 25 mg daily.  He will also be referred to pelvic floor physical therapy as a Jamesetta Sohyllis will be very beneficial to him. He was also instructed to take ibuprofen 600 mg 3 times a day for a month to help with inflammation. We will see him back in 3 months after his had a chance to try the new medications and undergo pelvic floor physical therapy.  Return in about 3 months (around 08/14/2016).  Hildred LaserBrian James Zena Vitelli, MD  [facility] [address], [address] [place], [place] [address] [phone]

## 2016-07-17 ENCOUNTER — Ambulatory Visit: Payer: BLUE CROSS/BLUE SHIELD | Attending: Urology | Admitting: Physical Therapy

## 2016-07-17 DIAGNOSIS — R278 Other lack of coordination: Secondary | ICD-10-CM | POA: Insufficient documentation

## 2016-07-17 DIAGNOSIS — M6281 Muscle weakness (generalized): Secondary | ICD-10-CM | POA: Insufficient documentation

## 2016-07-17 NOTE — Therapy (Addendum)
[facility] [address], [place], [address] Phone: [phone]   Fax:  [phone]  Physical Therapy Evaluation  Patient Details  Name: Douglas Baldwin MRN: [id number] Date of Birth: 11-03-1980 Referring Provider: Dr. Sherryl BartersBudzyn  Encounter Date: 07/17/2016      PT End of Session - 07/26/16 1954    Visit Number 1   Number of Visits 12   Date for PT Re-Evaluation 10/02/16   PT Start Time 0810   PT Stop Time 0930   PT Time Calculation (min) 80 min   Activity Tolerance Patient tolerated treatment well;No increased pain      Past Medical History:  Diagnosis Date  . Calculus (=stone) 05/02/2013    No past surgical history on file.  There were no vitals filed for this visit.       Subjective Assessment - 07/26/16 2031    Subjective 1) Pt reports he has had pain in the bladder area for the past 9 months. There are days where I have little pain and then a lot of pain, burning of urination , and urgency (every 30 min) .  Pain level is 8-9/10 across 4/7 days of the week.  Pain is temporary relieved for one day  with Tylenol. 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 climbing a ladder 30-40 x /day.  Pt has worked this job for 4 months. Prior to this position, pt has been performing heavy lifting on his job.    2) Frequent trips to the toilet due to incomplete emptying of bowels and urination and difficulty with initiation.      Patient is accompained by: Interpreter   Pertinent History Physical activity: walking 2 x/ week 30-45 min.     Patient Stated Goals Decrease the pelvic pain and improve urination and bowel movements             [facility] - 07/26/16 1951      Assessment   Medical Diagnosis chronic  prostatitis    Referring Provider Dr. Sherryl BartersBudzyn     Precautions   Precautions None     Restrictions   Weight Bearing Restrictions No     Balance Screen   Has the patient fallen in the past 6 months No     Prior Function   Level of Independence Independent     Observation/Other Assessments   Observations slumped posture, sacral sitting     Coordination   Gross Motor Movements are Fluid and Coordinated --  limited diaphragmatic breathing   Fine Motor Movements are Fluid and Coordinated --  abdominal straining with cue for BM, lifting     Other:   Other/ Comments lifting: with feet together, poor alignment      PROM   Overall PROM Comments hip IR in supine L ~15 deg pre Tx, ~post Tx ~ 30 deg      Palpation   Spinal mobility increased paraspinal mm    Palpation comment increased mm tensions at L: obt int //coccygeus. pre Tx: tenderness at L medial aspect of iliac crest/ pubic, post Tx: less tenderenss                   Pelvic Floor Special Questions - 07/26/16 1953    Diastasis Recti 3 fingers above umbilicus  PT Long Term Goals - 07/26/16 1958      PT LONG TERM GOAL #1   Title Pt will report decreased frequency to urinate from every 30 min to once every 2 hours in order to return to ADLs, work and home activities   Time 12   Period Weeks   Status New     PT LONG TERM GOAL #2   Title Pt will report decreased burning with urination by 50% across 2 weeks in order to improve QOL   Time 12   Period Weeks   Status New     PT LONG TERM GOAL #3   Title Pt will report no pain at the belt level of his pants when he sits in order to sit comfortably   Time 12   Period Weeks   Status New     PT LONG TERM GOAL #4   Title Pt will demo proper lifting mechanics with deep core co-activation when lifting 25, 40 lb crate x 3 reps in order to minimize relapse of injury.    Time 12   Period Weeks   Status New     PT LONG TERM GOAL  #5   Title Pt will demo <1 fingers width separation of his abdomnal mm and no mm tensions/ tenderness across 2 visits in order to decrease pain and improve urinary function   Time 12   Period Weeks   Status New               Plan - 07/26/16 1954    Clinical Impression Statement Pt is a 35 yo who c/o pain over the bladder area with frequent urination and incomplete emptying. Pt's presentations includes signs of an ineffective intraabdominal pressure system with diastasis recti, increased paraspinal mm and pelvic floor mm tensions, dyscoordination of deep core mm in functional activities including toileting and lifting.Following Tx today, pt demo'd decreased abdominal mm separation and improved lifting and toileting mechanics.     Rehab Potential Good   Clinical Impairments Affecting Rehab Potential heavy lifting on the job   PT Frequency 1x / week   PT Duration 12 weeks   PT Treatment/Interventions ADLs/Self Care Home Management;Aquatic Therapy;Moist Heat;Therapeutic exercise;Therapeutic activities;Functional mobility training;Stair training;Gait training;Neuromuscular re-education;Patient/family education;Balance training;Scar mobilization;Manual lymph drainage;Manual techniques;Energy conservation   Consulted and Agree with Plan of Care Patient      Patient will benefit from skilled therapeutic intervention in order to improve the following deficits and impairments:  Decreased strength, Decreased range of motion, Decreased endurance, Decreased activity tolerance, Decreased safety awareness, Postural dysfunction, Impaired tone, Increased muscle spasms, Hypermobility, Decreased mobility, Decreased coordination, Pain, Improper body mechanics, Impaired flexibility  Visit Diagnosis: Muscle weakness (generalized)  Other lack of coordination     Problem List Patient Active Problem List   Diagnosis Date Noted  . Calculus (=stone) 05/02/2013    Mariane MastersYeung,Shin Yiing ,PT, DPT,  E-RYT  07/26/2016, 8:00 PM ,PT, DPT, E-RYT   [facility] [address] [place], [place], [address] Phone: [phone]   Fax:  [phone]  Name: Douglas Baldwin MRN: [id number] Date of Birth: October 27, 1980

## 2016-07-17 NOTE — Patient Instructions (Addendum)
You are now ready to begin training the deep core muscles system: diaphragm, transverse abdominis, pelvic floor . These muscles must work together as a team.           The key to these exercises to train the brain to coordinate the timing of these muscles and to have them turn on for long periods of time to hold you upright against gravity (especially important if you are on your feet all day).These muscles are postural muscles and play a role stabilizing your spine and bodyweight. By doing these repetitions slowly and correctly instead of doing crunches, you will achieve a flatter belly without a lower pooch. You are also placing your spine in a more neutral position and breathing properly which in turn, decreases your risk for problems related to your pelvic floor, abdominal, and low back such as pelvic organ prolapse, hernias, diastasis recti (separation of superficial muscles), disk herniations, spinal fractures. These exercises set a solid foundation for you to later progress to resistance/ strength training with therabands and weights and return to other typical fitness exercises with a stronger deeper core.   Do level 1 : 10 reps Do level 2: 10 reps (left and right = 1 rep) x 3 sets , 2 x day Do not progress to level 3 for 3-4 weeks. You know you are ready when you do not have any rocking of pelvis nor arching in your back                                                       Preserve the function of your pelvic floor, abdomen, and back.              Avoid decreased straining of abdominal/pelvic floor muscles with less              slouching,  holding your breath with lifting/bowel movements)    .

## 2016-07-24 ENCOUNTER — Ambulatory Visit: Payer: BLUE CROSS/BLUE SHIELD | Admitting: Physical Therapy

## 2016-07-24 DIAGNOSIS — M6281 Muscle weakness (generalized): Secondary | ICD-10-CM | POA: Diagnosis not present

## 2016-07-24 DIAGNOSIS — R278 Other lack of coordination: Secondary | ICD-10-CM

## 2016-07-24 NOTE — Therapy (Addendum)
[facility] [address] [place], [place], [address] Phone: [phone]   Fax:  [phone]  Physical Therapy Treatment  Patient Details  Name: Douglas Baldwin MRN: [id number] Date of Birth: 1981-04-19 Referring Provider: Dr. Sherryl BartersBudzyn  Encounter Date: 07/24/2016      PT End of Session - 07/26/16 1954    Visit Number 2   Number of Visits 12   Date for PT Re-Evaluation 10/02/16   PT Start Time 0810   PT Stop Time 0930   PT Time Calculation (min) 80 min   Activity Tolerance Patient tolerated treatment well;No increased pain      Past Medical History:  Diagnosis Date  . Calculus (=stone) 05/02/2013    No past surgical history on file.  There were no vitals filed for this visit.      Subjective Assessment - 07/26/16 2031    Subjective Pt reports he can urinate more with less difficulty. Pt states the pain and Sx have calmed down a little. Pt feels his Sx have improved by 75%.    Patient is accompained by: Interpreter   Pertinent History Physical activity: walking 2 x/ week 30-45 min.     Patient Stated Goals Decrease the pelvic pain and improve urination and bowel movements            [facility] PT Assessment - 07/26/16 2028      Palpation   Palpation comment increased mm tensions at L: obt int //coccygeus. pre Tx: tenderness at L medial aspect of iliac crest/ pubic, post Tx: less tenderenss                    Pelvic Floor Special Questions - 07/26/16 2023    Diastasis Recti 1 fingers width    External Perineal Exam pt consented to doff pants and undergarments verbally.   interpreter in the room   External Palpation see assessment section           [facility] Treatment/Exercise - 07/26/16 2027      Therapeutic Activites    Therapeutic Activities --  see pt instructions     Manual Therapy   Manual therapy comments STM, MWM at problem areas noted in assessment section                       PT Long Term Goals - 07/26/16 1958      PT LONG TERM GOAL #1   Title Pt will report decreased frequency to urinate from every 30 min to once every 2 hours in order to return to ADLs, work and home activities   Time 12   Period Weeks   Status New     PT LONG TERM GOAL #2   Title Pt will report decreased burning with urination by 50% across 2 weeks in order to improve QOL   Time 12   Period Weeks   Status New     PT LONG TERM GOAL #3   Title Pt will report no pain at the belt level of his pants when he sits in order to sit comfortably   Time 12   Period Weeks   Status New     PT LONG TERM GOAL #4   Title Pt will demo proper lifting mechanics with deep core co-activation when lifting 25, 40 lb crate x 3 reps in order to minimize relapse of injury.    Time 12   Period Weeks  Status New     PT LONG TERM GOAL #5   Title Pt will demo <1 fingers width separation of his abdomnal mm and no mm tensions/ tenderness across 2 visits in order to decrease pain and improve urinary function   Time 12   Period Weeks   Status New               Plan - 07/26/16 2009    Clinical Impression Statement Pt feels his Sx have improved by 75% with less difficuly since last session and today showed significantly decreased abdominal separation (1 finger width). Following Tx today, pt demo'd less pelvic floor mm tensions and tenderness. Pt reported he felt looser in the pelvic floor area. Anticipate pt will continue to progress well towards his goals with skilled PT.    Rehab Potential Good   Clinical Impairments Affecting Rehab Potential heavy lifting on the job   PT Frequency 1x / week   PT Duration 12 weeks   PT Treatment/Interventions ADLs/Self Care Home Management;Aquatic Therapy;Moist Heat;Therapeutic exercise;Therapeutic activities;Functional mobility training;Stair training;Gait training;Neuromuscular re-education;Patient/family education;Balance training;Scar  mobilization;Manual lymph drainage;Manual techniques;Energy conservation   Consulted and Agree with Plan of Care Patient      Patient will benefit from skilled therapeutic intervention in order to improve the following deficits and impairments:  Decreased strength, Decreased range of motion, Decreased endurance, Decreased activity tolerance, Decreased safety awareness, Postural dysfunction, Impaired tone, Increased muscle spasms, Hypermobility, Decreased mobility, Decreased coordination, Pain, Improper body mechanics, Impaired flexibility  Visit Diagnosis: Muscle weakness (generalized)  Other lack of coordination     Problem List Patient Active Problem List   Diagnosis Date Noted  . Calculus (=stone) 05/02/2013    Mariane MastersYeung,Shin Yiing ,PT, DPT, E-RYT  07/26/2016, 8:31 PM  [place] [facility] [address] [place], [place], [address] Phone: [phone]   Fax:  [phone]  Name: Douglas Baldwin MRN: [id number] Date of Birth: 1981/05/25

## 2016-07-26 NOTE — Addendum Note (Signed)
Addended by: Mariane MastersYEUNG, SHIN-YIING on: 07/26/2016 08:02 PM   Modules accepted: Orders

## 2016-07-31 ENCOUNTER — Ambulatory Visit: Payer: BLUE CROSS/BLUE SHIELD | Admitting: Physical Therapy

## 2016-07-31 DIAGNOSIS — M6281 Muscle weakness (generalized): Secondary | ICD-10-CM

## 2016-07-31 DIAGNOSIS — R278 Other lack of coordination: Secondary | ICD-10-CM

## 2016-07-31 NOTE — Patient Instructions (Addendum)
Abdominal massage   Recommended  Forklift straps when lifting heavy objects

## 2016-07-31 NOTE — Therapy (Addendum)
[place] [facility] [address] [place], [place], [address] Phone: [phone]   Fax:  [phone]  Physical Therapy Treatment  Patient Details  Name: Douglas Baldwin MRN: [id number] Date of Birth: 1981-03-25 Referring Provider: Dr. Sherryl BartersBudzyn  Encounter Date: 07/31/2016      PT End of Session - 08/06/16 0956    Visit Number 3   Number of Visits 12   Date for PT Re-Evaluation 10/02/16   PT Start Time 0807   PT Stop Time 0910   PT Time Calculation (min) 63 min   Activity Tolerance Patient tolerated treatment well;No increased pain   Behavior During Therapy WFL for tasks assessed/performed      Past Medical History:  Diagnosis Date  . Calculus (=stone) 05/02/2013    No past surgical history on file.  There were no vitals filed for this visit.      Subjective Assessment - 08/01/16 0953    Subjective After last session, he had more control with his body and he feels he is improving with the exercsies and the therapy. Pt continues to feel he has [facility]  However two days ago, pt had to lift 100lb with another worker 5 x without use of an assistive device and after that day, pt noticed difficulty with urination again with improvement rate dropping down from 75% to 20%.  Pt used the proper lifting technique that he learned from PT and they have helped him at work. But he feels even with the use of the proper lifting technique, there is no position to lift the heavy object without straining his back. Today his low back feels tired after the strenuous lifting.  Frequency urination has decreased since SOC to every 2 hours. Pt reports the burning with and without urination has decreased from 10/10 to 3/10 but remains constant. Pt still feels alot of tension on his lower abdomen which remains constant.      Patient is accompained by: Interpreter   Pertinent History Physical activity: walking 2 x/ week 30-45 min.      Patient Stated Goals Decrease the pelvic pain and improve urination and bowel movements                   Pelvic Floor Asseassment - 08/01/16 0953    Diastasis Recti 1 fingers width    External Perineal Exam pt consented to doff pants and undergarments verbally.   interpreter in the room   External Palpation increased mm tensions and tenderness L >R , bulbospongiosus, ischiocavernosus , deep perineal mm, distal end of abdominal mm suprapubic             [facility] Adult PT Treatment/Exercise - 08/01/16 0954      Therapeutic Activites    Therapeutic Activities --  see pt instructions     Manual Therapy   Manual therapy comments STM, MWM at problem areas noted in assessment section   abdominal massage                PT Education - 08/06/16 0956    Education provided Yes   Education Details HEP   Person(s) Educated Patient   Methods Explanation;Demonstration;Tactile cues;Verbal cues;Handout   Comprehension Returned demonstration;Verbalized understanding;Verbal cues required;Tactile cues required             PT Long Term Goals - 07/31/16 1837      PT LONG TERM GOAL #1   Title Pt will report decreased frequency  to urinate from every 30 min to once every 2 hours in order to return to ADLs, work and home activities   Time 12   Period Weeks   Status On-going     PT LONG TERM GOAL #2   Title Pt will report decreased burning with urination by 50% across 2 weeks in order to improve QOL   Time 12   Period Weeks   Status On-going     PT LONG TERM GOAL #3   Title Pt will report no pain at the belt level of his pants when he sits in order to sit comfortably   Time 12   Period Weeks   Status On-going     PT LONG TERM GOAL #4   Title Pt will demo proper lifting mechanics with deep core co-activation when lifting 25, 40 lb crate x 3 reps in order to minimize relapse of injury.    Time 12   Period Weeks   Status On-going     PT LONG TERM GOAL #5   Title  Pt will demo <1 fingers width separation of his abdomnal mm and no mm tensions/ tenderness across 2 visits in order to decrease pain and improve urinary function   Time 12   Period Weeks   Status Achieved     Additional Long Term Goals   Additional Long Term Goals Yes     PT LONG TERM GOAL #6   Title Pt will decrease his NIH-CPSI score from 86% to < 50% in order to restore pelvic floor function and QOL   Time 12   Period Weeks   Status On-going           Plan - 08/06/16 1046    Clinical Impression Statement Pt reported he has had improvement of pain by 75% across the first 2 visits. He stated the burning sensation with urination decreased in intensity from 10/10 to  3/10 but does still remain constant. He also had less difficulty with initiating urination.   However, today at his 3rd visit, pt reported his improvement from 75% has dropped to 20% with a relapse of Sx following heavy lifting at work one day. Pt had to lift 100lb (5 repetitions) with another coworker without assistive devices when there was a malfunction to the machine that typically performed the task.  Prior to Tx today, pt reported his remaining complaints included constant pain in the pelvic and suprapubic area and with the burning sensation during urination.   Despite the heavy lifting incident, pt continued to show resolved diastasis recti as he has remained compliant with his exercises. However, his pelvic floor mm showed significantly increased tenderness and tensions over the urogenital triangle region which is likely associated his recent lifting incident. Following manual Tx today, pt reported  tenderness and showed decreased mm tension in his perineal mm and suprapubic area. His pain level dropped from 7/10 to 3/10 following Tx today.   At his next session, plan to initiate resistance band exercises to train outer core and thoracolumbar mm system to faciliate optimal lifting with less strain at the abdominal/pelvic  area.  In order for pt to continue to make progress with his pelvic floor and urinary issues, PT thinks that pt would benefit from a MD note to present to his employer that pt must be provided a lifting device to assist with lifting > 100 lb with the help of a co-worker or to limit the max weight he can lift to < 75-80  lb (max rep of 3)  across the next 8 weeks.   Pt was recommended buy and have at hand a forklift strap when lifting 100 lb with his co-worker from the floor or to utilize ramps and ropes to push object to a higher level instead of lifting. Pt voiced understanding. Anticipate pt will advance towards his remaining goals with skilled PT and modifications to his lifting loads.     Rehab Potential Good   Clinical Impairments Affecting Rehab Potential heavy lifting on the job   PT Frequency 1x / week   PT Duration 12 weeks   PT Treatment/Interventions ADLs/Self Care Home Management;Aquatic Therapy;Moist Heat;Therapeutic exercise;Therapeutic activities;Functional mobility training;Stair training;Gait training;Neuromuscular re-education;Patient/family education;Balance training;Scar mobilization;Manual lymph drainage;Manual techniques;Energy conservation      Patient will benefit from skilled therapeutic intervention in order to improve the following deficits and impairments:  Decreased strength, Decreased range of motion, Decreased endurance, Decreased activity tolerance, Decreased safety awareness, Postural dysfunction, Impaired tone, Increased muscle spasms, Hypermobility, Decreased mobility, Decreased coordination, Pain, Improper body mechanics, Impaired flexibility  Visit Diagnosis: Muscle weakness (generalized)  Other lack of coordination     Problem List Patient Active Problem List   Diagnosis Date Noted  . Calculus (=stone) 05/02/2013    Mariane MastersYeung,Shin Yiing ,PT, DPT, E-RYT  08/06/2016, 10:52 AM  [facility] [address] [place], [place], [address] Phone: [phone]   Fax:  [phone]  Name: Douglas Baldwin MRN: [id number] Date of Birth: 1981-05-20

## 2016-08-14 ENCOUNTER — Ambulatory Visit: Payer: BLUE CROSS/BLUE SHIELD | Attending: Urology | Admitting: Physical Therapy

## 2016-08-14 DIAGNOSIS — M6281 Muscle weakness (generalized): Secondary | ICD-10-CM | POA: Diagnosis present

## 2016-08-14 DIAGNOSIS — R278 Other lack of coordination: Secondary | ICD-10-CM | POA: Diagnosis present

## 2016-08-14 NOTE — Patient Instructions (Signed)
Green band under feet  squats and bicep curls  10 x 2       Side stepping with band 10 ft Left and Right        Lat pull downs  Band over door  10 x 2 with R foot forward, 10 x 2 L foot forward       "Starting the lawn mover"  Band under feet  R hand holding L end of the band by L pocket, Inhale, exhale pull band across midline, keep elbow by side  10 x 2

## 2016-08-14 NOTE — Therapy (Signed)
[facility] MAIN [facility] [address] [place], [place], [address] Phone: [phone]   Fax:  [phone]  Physical Therapy Treatment  Patient Details  Name: Douglas Baldwin MRN: [id number] Date of Birth: [birth year] Referring Provider: Dr. Sherryl Barters  Encounter Date: 08/14/2016      PT End of Session - 08/14/16 1849    Visit Number 4   Number of Visits 12   Date for PT Re-Evaluation 10/02/16   PT Start Time 0810   PT Stop Time 0905   PT Time Calculation (min) 55 min   Activity Tolerance Patient tolerated treatment well;No increased pain   Behavior During Therapy WFL for tasks assessed/performed      Past Medical History:  Diagnosis Date  . Calculus (=stone) 05/02/2013    No past surgical history on file.  There were no vitals filed for this visit.      Subjective Assessment - 08/14/16 0818    Subjective Pt reported his abdominopelvic pain has decreased to its constant level of pain at  2/10 since last session as he has not had to lift anything heavy > ~ 40 lb.  Pt does still feel discomfort with bending and lifting light objects.      Patient is accompained by: Interpreter   Pertinent History Physical activity: walking 2 x/ week 30-45 min.     Patient Stated Goals Decrease the pelvic pain and improve urination and bowel movements            [facility] PT Assessment - 08/14/16 1842      Other:   Other/ Comments lifting, narrow based of support                  Pelvic Floor Special Questions - 08/14/16 1842    Diastasis Recti no fingers width   External Perineal Exam pt consented to doff pants and undergarments verbally.   interpreter in the room   External Palpation minor tensions located at ischiocavernosus on L                    PT Education - 08/14/16 1849    Education provided Yes   Education Details HEP   Person(s) Educated Patient   Methods Explanation;Demonstration;Tactile cues;Verbal  cues;Handout   Comprehension Returned demonstration;Verbalized understanding             PT Long Term Goals - 07/31/16 1837      PT LONG TERM GOAL #1   Title Pt will report decreased frequency to urinate from every 30 min to once every 2 hours in order to return to ADLs, work and home activities   Time 12   Period Weeks   Status On-going     PT LONG TERM GOAL #2   Title Pt will report decreased burning with urination by 50% across 2 weeks in order to improve QOL   Time 12   Period Weeks   Status On-going     PT LONG TERM GOAL #3   Title Pt will report no pain at the belt level of his pants when he sits in order to sit comfortably   Time 12   Period Weeks   Status On-going     PT LONG TERM GOAL #4   Title Pt will demo proper lifting mechanics with deep core co-activation when lifting 25, 40 lb crate x 3 reps in order to minimize relapse of injury.    Time 12   Period Weeks  Status On-going     PT LONG TERM GOAL #5   Title Pt will demo <1 fingers width separation of his abdomnal mm and no mm tensions/ tenderness across 2 visits in order to decrease pain and improve urinary function   Time 12   Period Weeks   Status Achieved     Additional Long Term Goals   Additional Long Term Goals Yes     PT LONG TERM GOAL #6   Title Pt will decrease his NIH-CPSI score from 86% to < 50% in order to restore pelvic floor function and QOL   Time 12   Period Weeks   Status On-going               Plan - 08/14/16 1850    Clinical Impression Statement Pt returns today after a relapse of pain and pt shows significantly decreased pelvic floor mm tensions. Pt's pain level is 2/10 with remaining tenderness on L lower abdomen and minor tensions at L ischiocavernosus mm. Pt reported decreased pain following Tx. Pt was progressed to functional exercises to strengthen outer core mm, thoracolumbar, and hip external rotator mm with resistance band today. Pt performed correctly. Pt  continues to benefit from skilled PT.     Rehab Potential Good   Clinical Impairments Affecting Rehab Potential heavy lifting on the job   PT Frequency 1x / week   PT Duration 12 weeks   PT Treatment/Interventions ADLs/Self Care Home Management;Aquatic Therapy;Moist Heat;Therapeutic exercise;Therapeutic activities;Functional mobility training;Stair training;Gait training;Neuromuscular re-education;Patient/family education;Balance training;Scar mobilization;Manual lymph drainage;Manual techniques;Energy conservation   Consulted and Agree with Plan of Care Patient      Patient will benefit from skilled therapeutic intervention in order to improve the following deficits and impairments:  Decreased strength, Decreased range of motion, Decreased endurance, Decreased activity tolerance, Decreased safety awareness, Postural dysfunction, Impaired tone, Increased muscle spasms, Hypermobility, Decreased mobility, Decreased coordination, Pain, Improper body mechanics, Impaired flexibility  Visit Diagnosis: Muscle weakness (generalized)  Other lack of coordination     Problem List Patient Active Problem List   Diagnosis Date Noted  . Calculus (=stone) 05/02/2013    Mariane MastersYeung,Shin Yiing ,PT, DPT, E-RYT  08/14/2016, 6:53 PM  [facility] [address] [place], [place], [address] Phone: [phone]   Fax:  [phone]  Name: Douglas Baldwin MRN: [id number] Date of Birth: 14-Dec-1980

## 2016-08-18 ENCOUNTER — Ambulatory Visit: Payer: BLUE CROSS/BLUE SHIELD | Admitting: Urology

## 2016-08-28 ENCOUNTER — Encounter: Payer: BLUE CROSS/BLUE SHIELD | Admitting: Physical Therapy

## 2016-09-11 ENCOUNTER — Ambulatory Visit: Payer: BLUE CROSS/BLUE SHIELD | Attending: Urology | Admitting: Physical Therapy

## 2016-09-11 DIAGNOSIS — M6281 Muscle weakness (generalized): Secondary | ICD-10-CM | POA: Diagnosis not present

## 2016-09-11 DIAGNOSIS — R278 Other lack of coordination: Secondary | ICD-10-CM | POA: Diagnosis present

## 2016-09-11 NOTE — Therapy (Signed)
[place] MAIN [facility] SERVICES [address] [place], [place], [address] Phone: [phone]   Fax:  [phone]  Physical Therapy Treatment  Patient Details  Name: Douglas Baldwin MRN: [id number] Date of Birth: Aug 24, 1980 Referring Provider: Dr. Pilar Jarvis  Encounter Date: 09/11/2016      PT End of Session - 09/11/16 0855    Visit Number 5   Number of Visits 12   Date for PT Re-Evaluation 10/02/16   PT Start Time 0805   PT Stop Time 0910   PT Time Calculation (min) 65 min   Activity Tolerance Patient tolerated treatment well;No increased pain   Behavior During Therapy WFL for tasks assessed/performed      Past Medical History:  Diagnosis Date  . Calculus (=stone) 05/02/2013    No past surgical history on file.  There were no vitals filed for this visit.      Subjective Assessment - 09/11/16 0811    Subjective Ptr eported he has been using the fork lift more when lifting. Pt finds the band exercises are helpful. Pt's urine has become stronger. Burning sensation is less. Frequency occurs every 1.5 hrs.  Pt is thinking of joining a gym.  Daily intake of fluid: 2-3 bottles 16 fl oz, 1 cup coffee, 1 cup tea.     Patient is accompained by: Interpreter   Pertinent History Physical activity: walking 2 x/ week 30-45 min.     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           [facility] Adult PT Treatment/Exercise - 09/11/16 5537      Therapeutic Activites    Therapeutic Activities --  see pt instructions     Neuro Re-ed    Neuro Re-ed Details  see pt  instructions                PT Education - 09/11/16 0857    Education provided Yes   Education Details HEP   Person(s) Educated Patient   Methods Explanation;Demonstration;Tactile cues;Verbal cues;Handout   Comprehension Returned demonstration;Verbalized understanding             PT Long Term Goals - 09/11/16 0817      PT LONG TERM GOAL #1   Title Pt will report decreased frequency to urinate from every 30 min to once every 2 hours in order to return to ADLs, work and home activities (2/2: every 1.5hr)    Time 12   Period Weeks   Status Achieved     PT LONG TERM GOAL #2   Title Pt will report decreased burning with urination by 50% across 2 weeks in order to improve QOL    Time 12   Period Weeks   Status Achieved     PT LONG TERM GOAL #3   Title Pt will report no pain at the belt level of his pants when he sits in order to sit comfortably (2/2: pressure not pain)  Time 12   Period Weeks   Status Achieved     PT LONG TERM GOAL #4   Title Pt will demo proper lifting mechanics with deep core co-activation when lifting 25, 40 lb crate x 3 reps in order to minimize relapse of injury.    Period Weeks   Status Partially Met     PT LONG TERM GOAL #5   Title Pt will demo <1 fingers width separation of his abdomnal mm and no mm tensions/ tenderness across 2 visits in order to decrease pain and improve urinary function   Time 12   Period Weeks   Status Achieved     Additional Long Term Goals   Additional Long Term Goals Yes     PT LONG TERM GOAL #6   Title Pt will decrease his NIH-CPSI score from 86% to < 50% in order to restore pelvic floor function and QOL (2/2: 46%)    Time 12   Period Weeks   Status Achieved     PT LONG TERM GOAL #7   Title Pt will demo proper technique and co-activation of deep core mm, thoracolumbar mm with gym fitness routines in order to minimzie relapse of Sx   Time 12   Period Weeks   Status New               Plan -  09/11/16 1610    Clinical Impression Statement Pt continues have excellent progress with achievement of 6/7 goals . Pt has recovered his relapse of Sx after excessive lifting without assistive device.  His NIH-CPSI has decreased by 50%. Pt demo'd increased deep core strength, reports of 70% improvement with urination. His remaining Sx with burning sensations is likely due to his poor water intake. Educated pt on bladder irritants and pt voiced understanding. Anticiapte pt will achieve goals with continued PT.   In order for pt to continue to make progress and to minimize relapse of Sx with his pelvic floor and urinary issues, PT thinks that pt would benefit from a MD note to present to his employer that pt must be provided a lifting device to assist with lifting > 100 lb with the help of a co-worker or to limit the max weight he can lift to < 75-80  lb (max rep of 3).       Rehab Potential Good   Clinical Impairments Affecting Rehab Potential heavy lifting on the job   PT Frequency 1x / week   PT Duration 12 weeks   PT Treatment/Interventions ADLs/Self Care Home Management;Aquatic Therapy;Moist Heat;Therapeutic exercise;Therapeutic activities;Functional mobility training;Stair training;Gait training;Neuromuscular re-education;Patient/family education;Balance training;Scar mobilization;Manual lymph drainage;Manual techniques;Energy conservation   Consulted and Agree with Plan of Care Patient      Patient will benefit from skilled therapeutic intervention in order to improve the following deficits and impairments:  Decreased strength, Decreased range of motion, Decreased endurance, Decreased activity tolerance, Decreased safety awareness, Postural dysfunction, Impaired tone, Increased muscle spasms, Hypermobility, Decreased mobility, Decreased coordination, Pain, Improper body mechanics, Impaired flexibility  Visit Diagnosis: Muscle weakness (generalized)  Other lack of  coordination     Problem List Patient Active Problem List   Diagnosis Date Noted  . Calculus (=stone) 05/02/2013    Jerl Mina 09/11/2016, 7:11 PM  [place] MAIN [facility] SERVICES [address] [place], [place], [address] Phone: [phone]   Fax:  [phone]  Name: Douglas Baldwin MRN: [id number] Date of Birth: [birth year]

## 2016-09-11 NOTE — Patient Instructions (Addendum)
    Currently your ratio:1 -16 floz (2 cups)  of coffee.tea ,  2 --16 fl oz of water    Try this week to decrease burning sensations: 1 -16 floz (2 cups)  of coffee.tea,  Try to increase to 3- 16 fl oz.       Following week, decrease from 2 cups of coffe/tea to 1 cup , and have 4  (16 fl oz ) of water       _________________________________________________   Bridging series w/ resistive band other side of doorknob:  Level 1:  Position:  Elbows bent, knees hip width apart, heels on chair or stool/  Stabilization points: shoulders, upper arms, back of head pressed into floor. Heel press downward.   Movement: inhale do nothing, exhale pull band by side, lower fists to floor completely while lifting hips.Keep stabilization points engaged when you allow the band to go back to starting position  10 x 2 reps          Level 2:  Position:  Elbows straight, arms raised to ceiling at shoulder height, knees apart like a ballerina,heels together on chair or stool   Stabilization points: shoulders, upper arms, back of head pressed into floor. Heel press downward.   Movement: inhale do nothing, exhale pull band by side, lower fists to floor completely while lifting hips. Keep stabilization points engaged when you allow the band to go back to starting position   10 x 2 reps  Shoulder training: Try to imagine you are squeezing a pencil under your armpit and your shoulder blades are down away from your ears and towards each other

## 2016-09-16 ENCOUNTER — Telehealth: Payer: Self-pay | Admitting: Physical Therapy

## 2016-09-16 ENCOUNTER — Ambulatory Visit: Payer: BLUE CROSS/BLUE SHIELD | Admitting: Urology

## 2016-09-16 NOTE — Telephone Encounter (Signed)
PT called Dr. Jorja LoaBudzyn's office and left a message with Misty StanleyLisa stating that pt would benefit from an MD note on lifting restrictions for pt to present to employer. Details can be accessed in his latest PT note. Update on his progress also detailed in the note. Misty StanleyLisa voiced understanding and will deliver note to Dr. Sherryl BartersBudzyn.

## 2016-09-25 ENCOUNTER — Ambulatory Visit: Payer: BLUE CROSS/BLUE SHIELD | Admitting: Physical Therapy

## 2016-09-28 ENCOUNTER — Ambulatory Visit: Payer: BLUE CROSS/BLUE SHIELD | Admitting: Physical Therapy

## 2016-09-28 DIAGNOSIS — R278 Other lack of coordination: Secondary | ICD-10-CM

## 2016-09-28 DIAGNOSIS — M6281 Muscle weakness (generalized): Secondary | ICD-10-CM | POA: Diagnosis not present

## 2016-09-28 NOTE — Therapy (Signed)
[name] [facility] SERVICES [address] [place], [place], [address] Phone: [phone]   Fax:  [phone]  Physical Therapy Treatment  Patient Details  Name: Douglas Baldwin MRN: [id number] Date of Birth: 04/18/1981 Referring Provider: Dr. Pilar Jarvis  Encounter Date: 09/28/2016      PT End of Session - 09/28/16 1611    Visit Number 6   Number of Visits 12   Date for PT Re-Evaluation 10/02/16   PT Start Time 6948   PT Stop Time 1400   PT Time Calculation (min) 57 min   Activity Tolerance Patient tolerated treatment well;No increased pain   Behavior During Therapy WFL for tasks assessed/performed      Past Medical History:  Diagnosis Date  . Calculus (=stone) 05/02/2013    No past surgical history on file.  There were no vitals filed for this visit.      Subjective Assessment - 09/28/16 1607    Subjective Pt reported  he continues to feel better since the last visit. The HEP causes tiredness in his back    Patient is accompained by: Interpreter   Pertinent History Physical activity: walking 2 x/ week 30-45 min.     Patient Stated Goals Decrease the pelvic pain and improve urination and bowel movements            [facility] PT Assessment - 09/28/16 1608      Other:   Other/Comments overuse and rounded shoulders in weight training activities      Posture/Postural Control   Posture Comments increased lumbar extension with bird dog, required cues for postural stability                     [facility] Adult PT Treatment/Exercise - 09/28/16 1608      Therapeutic Activites    Therapeutic Activities --  video recorded alignment, technique on weight machines     Neuro Re-ed    Neuro Re-ed Details  video recoreded alignment and technique on cable column                      PT Long Term Goals - 09/28/16 1614      PT LONG TERM GOAL #1   Title Pt will report decreased frequency to urinate from every 30  min to once every 2 hours in order to return to ADLs, work and home activities (2/2: every 1.5hr)    Time 12   Period Weeks   Status Achieved     PT LONG TERM GOAL #2   Title Pt will report decreased burning with urination by 50% across 2 weeks in order to improve QOL    Time 12   Period Weeks   Status Achieved     PT LONG TERM GOAL #3   Title Pt will report no pain at the belt level of his pants when he sits in order to sit comfortably (2/2: pressure not pain)    Time 12   Period Weeks   Status Achieved     PT LONG TERM GOAL #4   Title Pt will demo proper lifting mechanics with deep core co-activation when lifting 25, 40 lb crate x 3 reps in order to minimize relapse of injury.    Period Weeks   Status Partially Met     PT LONG TERM GOAL #5   Title Pt will demo <1 fingers width separation of his abdomnal mm and no mm tensions/ tenderness across  2 visits in order to decrease pain and improve urinary function   Time 12   Period Weeks   Status Achieved     PT LONG TERM GOAL #6   Title Pt will decrease his NIH-CPSI score from 86% to < 50% in order to restore pelvic floor function and QOL (2/2: 46%)    Time 12   Period Weeks   Status Achieved     PT LONG TERM GOAL #7   Title Pt will demo proper technique and co-activation of deep core mm, thoracolumbar mm with gym fitness routines in order to minimzie relapse of Sx   Time 12   Period Weeks   Status Partially Met               Plan - 09/28/16 1612    Clinical Impression Statement Pt demo'd proper alignment and deep core co-activation with weight machines and fitness routine after today's session. Pt required cuing for postural stability. Pt continues to progress towards his goals and will be ready for d/c with one more visit.    Rehab Potential Good   Clinical Impairments Affecting Rehab Potential heavy lifting on the job   PT Frequency 1x / week   PT Duration 12 weeks   PT Treatment/Interventions ADLs/Self Care Home  Management;Aquatic Therapy;Moist Heat;Therapeutic exercise;Therapeutic activities;Functional mobility training;Stair training;Gait training;Neuromuscular re-education;Patient/family education;Balance training;Scar mobilization;Manual lymph drainage;Manual techniques;Energy conservation   Consulted and Agree with Plan of Care Patient      Patient will benefit from skilled therapeutic intervention in order to improve the following deficits and impairments:  Decreased strength, Decreased range of motion, Decreased endurance, Decreased activity tolerance, Decreased safety awareness, Postural dysfunction, Impaired tone, Increased muscle spasms, Hypermobility, Decreased mobility, Decreased coordination, Pain, Improper body mechanics, Impaired flexibility  Visit Diagnosis: Muscle weakness (generalized)  Other lack of coordination     Problem List Patient Active Problem List   Diagnosis Date Noted  . Calculus (=stone) 05/02/2013    Jerl Mina ,PT, DPT, E-RYT  09/28/2016, 4:14 PM  [place] MAIN [facility] SERVICES [address] [place], [place], [address] Phone: [phone]   Fax:  [phone]  Name: Douglas Baldwin MRN: [id number] Date of Birth: 02-04-1981

## 2016-10-08 ENCOUNTER — Ambulatory Visit: Payer: BLUE CROSS/BLUE SHIELD

## 2016-11-27 ENCOUNTER — Ambulatory Visit: Payer: BLUE CROSS/BLUE SHIELD | Attending: Urology | Admitting: Physical Therapy

## 2016-12-02 ENCOUNTER — Encounter: Payer: Self-pay | Admitting: Physical Therapy

## 2016-12-02 DIAGNOSIS — R278 Other lack of coordination: Secondary | ICD-10-CM

## 2016-12-02 DIAGNOSIS — M6281 Muscle weakness (generalized): Secondary | ICD-10-CM

## 2016-12-02 NOTE — Therapy (Signed)
[place] MAIN [facility] SERVICES [address] [place], [place], [address] Phone: [phone]   Fax:  [phone]  Patient Details  Name: Douglas Baldwin MRN: [id number] Date of Birth: Mar 16, 1981 Referring Provider:  Pilar Jarvis Encounter Date: 12/02/2016   DISCHARGE SUMMARY  Pt has met 6/7 goals with significantly improved symptoms. Pt has decreased urinary frequency, pelvic pain,and burning with urination. His NIH-Chronic Prostatitis Symptom Index score decreased by 50%, indicating significant restoration of pelvic floor function.   Pt has also showed resolved diastasis recti and less pelvic floor mm tensions. Pt demo'd proper coordination of his deep core mm and proper lifting mechanics to perform safely at work. Pt is ready for d/c at this time.  Thank you for your referral!           PT Long Term Goals - 09/28/16 1614      PT LONG TERM GOAL #1   Title Pt will report decreased frequency to urinate from every 30 min to once every 2 hours in order to return to ADLs, work and home activities (2/2: every 1.5hr)    Time 12   Period Weeks   Status Achieved     PT LONG TERM GOAL #2   Title Pt will report decreased burning with urination by 50% across 2 weeks in order to improve QOL    Time 12   Period Weeks   Status Achieved     PT LONG TERM GOAL #3   Title Pt will report no pain at the belt level of his pants when he sits in order to sit comfortably (2/2: pressure not pain)    Time 12   Period Weeks   Status Achieved     PT LONG TERM GOAL #4   Title Pt will demo proper lifting mechanics with deep core co-activation when lifting 25, 40 lb crate x 3 reps in order to minimize relapse of injury.    Period Weeks   Status Partially Met      PT LONG TERM GOAL #5   Title Pt will demo <1 fingers width separation of his abdomnal mm and no mm tensions/ tenderness across 2 visits in order to decrease pain and improve urinary function   Time 12   Period Weeks   Status Achieved     PT LONG TERM GOAL #6   Title Pt will decrease his NIH-CPSI score from 86% to < 50% in order to restore pelvic floor function and QOL (2/2: 46%)    Time 12   Period Weeks   Status Achieved     PT LONG TERM GOAL #7   Title Pt will demo proper technique and co-activation of deep core mm, thoracolumbar mm with gym fitness routines in order to minimzie relapse of Sx   Time 12   Period Weeks   Status Achieved         Jerl Mina ,PT, DPT, E-RYT  12/02/2016, 9:32 AM  [place] [address] [place], [place], [address] Phone: [phone]   Fax:  [phone]

## 2017-03-26 ENCOUNTER — Ambulatory Visit
Admission: EM | Admit: 2017-03-26 | Discharge: 2017-03-26 | Disposition: A | Discharge status: Home / Self Care | Payer: BLUE CROSS/BLUE SHIELD | Attending: Family Medicine | Admitting: Family Medicine

## 2017-03-26 DIAGNOSIS — Z0289 Encounter for other administrative examinations: Secondary | ICD-10-CM

## 2017-03-26 LAB — DEPT OF TRANSP DIPSTICK, URINE (ARMC ONLY)
Glucose, UA: NEGATIVE mg/dL
Protein, ur: NEGATIVE mg/dL
Specific Gravity, Urine: 1.02 (ref 1.005–1.030)

## 2017-03-26 NOTE — ED Triage Notes (Signed)
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

## 2017-03-26 NOTE — ED Provider Notes (Signed)
[facility]    CSN: [id number] Arrival date & time: 03/26/17  1147     History   Chief Complaint Chief Complaint  Patient presents with  . Commercial Driver's License Exam    HPI Douglas Baldwin is a 36 y.o. male.   HPI  This is a 36 year old male who presents for a DOT physical. He works and drives for [employer]. He denies any significant past medical history.        Past Medical History:  Diagnosis Date  . Calculus (=stone) 05/02/2013    Patient Active Problem List   Diagnosis Date Noted  . Calculus (=stone) 05/02/2013    Past Surgical History:  Procedure Laterality Date  . NO PAST SURGERIES         Home Medications    Prior to Admission medications   Medication Sig Start Date End Date Taking? Authorizing Provider  ibuprofen (ADVIL,MOTRIN) 800 MG tablet Take 1 tablet (800 mg total) by mouth 3 (three) times daily. 10/23/15   Vanna Scotland, MD  mirabegron ER (MYRBETRIQ) 25 MG TB24 tablet Take 1 tablet (25 mg total) by mouth daily. 05/14/16   Hildred Laser, MD  naproxen (NAPRELAN) 500 MG 24 hr tablet Take 500 mg by mouth 2 (two) times daily after a meal.    [provider]  ondansetron (ZOFRAN-ODT) 4 MG disintegrating tablet Take 4 mg by mouth every 8 (eight) hours as needed for nausea or vomiting.    [provider]  oxybutynin (DITROPAN) 5 MG tablet Take 1 tablet (5 mg total) by mouth every 8 (eight) hours as needed for bladder spasms. Urgency/ frequency symptoms Patient not taking: Reported on 07/17/2016 10/29/15   Vanna Scotland, MD    Family History History reviewed. No pertinent family history.  Social History Social History  Substance Use Topics  . Smoking status: Never Smoker  . Smokeless tobacco: Never Used  . Alcohol use No     Allergies   Patient has no known allergies.   Review of Systems Review of Systems  All other systems reviewed and are negative.    Physical Exam Triage Vital  Signs ED Triage Vitals  Enc Vitals Group     BP 03/26/17 1247 114/83     Pulse Rate 03/26/17 1247 60     Resp 03/26/17 1247 18     Temp 03/26/17 1247 98.2 F (36.8 C)     Temp Source 03/26/17 1247 Oral     SpO2 03/26/17 1247 99 %     Weight 03/26/17 1242 176 lb 2.4 oz (79.9 kg)     Height 03/26/17 1242 5\' 8"  (1.727 m)     Head Circumference --      Peak Flow --      Pain Score 03/26/17 1408 0     Pain Loc --      Pain Edu? --      Excl. in GC? --    No data found.   Updated Vital Signs BP 114/83 (BP Location: Left Arm)   Pulse 60   Temp 98.2 F (36.8 C) (Oral)   Resp 18   Ht 5\' 8"  (1.727 m)   Wt 176 lb 2.4 oz (79.9 kg)   SpO2 99%   BMI 26.78 kg/m   Visual Acuity Right Eye Distance: 20/20 (uncorrected) Left Eye Distance: 20/40 (uncorrected) Bilateral Distance: 20/25 (uncorrected)  Right Eye Near:   Left Eye Near:    Bilateral Near:     Physical Exam  Constitutional:  Referred to DOT physical sheet  Nursing note and vitals reviewed.    UC Treatments / Results  Labs (all labs ordered are listed, but only abnormal results are displayed) Labs Reviewed  DEPT OF TRANSP DIPSTICK, URINE([facility] ONLY) - Abnormal; Notable for the following:       Result Value   Hgb urine dipstick SMALL (*)    All other components within normal limits    EKG  EKG Interpretation None       Radiology No results found.  Procedures Procedures (including critical care time)  Medications Ordered in UC Medications - No data to display   Initial Impression / Assessment and Plan / UC Course  I have reviewed the triage vital signs and the nursing notes.  Pertinent labs & imaging results that were available during my care of the patient were reviewed by me and considered in my medical decision making (see chart for details).     Patient qualifies for 2 year certificate.  Final Clinical Impressions(s) / UC Diagnoses   Final diagnoses:  Encounter for  examination required by Department of Transportation (DOT)    New Prescriptions Discharge Medication List as of 03/26/2017  2:08 PM       Controlled Substance Prescriptions  Controlled Substance Registry consulted? Not Applicable   Lutricia Feil, PA-C 03/26/17 1919

## 2019-04-04 ENCOUNTER — Ambulatory Visit (INDEPENDENT_AMBULATORY_CARE_PROVIDER_SITE_OTHER): Payer: 59 | Admitting: Physician Assistant

## 2019-04-04 ENCOUNTER — Ambulatory Visit
Admission: RE | Admit: 2019-04-04 | Discharge: 2019-04-04 | Disposition: A | Discharge status: Home / Self Care | Payer: 59 | Source: Ambulatory Visit | Attending: Physician Assistant | Admitting: Physician Assistant

## 2019-04-04 ENCOUNTER — Other Ambulatory Visit: Payer: Self-pay

## 2019-04-04 ENCOUNTER — Encounter: Payer: Self-pay | Admitting: Physician Assistant

## 2019-04-04 DIAGNOSIS — R109 Unspecified abdominal pain: Secondary | ICD-10-CM | POA: Diagnosis not present

## 2019-04-04 DIAGNOSIS — Z87442 Personal history of urinary calculi: Secondary | ICD-10-CM | POA: Insufficient documentation

## 2019-04-04 DIAGNOSIS — R319 Hematuria, unspecified: Secondary | ICD-10-CM

## 2019-04-04 LAB — MICROSCOPIC EXAMINATION
Bacteria, UA: NONE SEEN
Epithelial Cells (non renal): NONE SEEN /hpf (ref 0–10)
RBC: NONE SEEN /hpf (ref 0–2)

## 2019-04-04 LAB — URINALYSIS, COMPLETE
Bilirubin, UA: NEGATIVE
Glucose, UA: NEGATIVE
Ketones, UA: NEGATIVE
Leukocytes,UA: NEGATIVE
Nitrite, UA: NEGATIVE
Protein,UA: NEGATIVE
Specific Gravity, UA: <1.005 — ABNORMAL LOW (ref 1.005–1.030)
Urobilinogen, Ur: 0.2 mg/dL (ref 0.2–1.0)
pH, UA: 6 (ref 5.0–7.5)

## 2019-04-04 NOTE — Progress Notes (Signed)
04/04/2019 11:34 AM   Cephas DarbyElias Flores [name] 18-May-1981 [id number]  CC: Left side pain, hematuria  HPI: Douglas Baldwin is a 38 y.o. male who presents to the clinic for evaluation of possible acute stone episode. He is an established [facility] patient who last saw Dr. Sherryl BartersBudzyn on 05/14/2016 for follow-up of UTI, chronic prostatitis, and microscopic hematuria. Last hematuria workup in 2017 was negative.  He denies having sought care with another urologist in the interim.  He does have a history of nephrolithiasis in 2008 and 2014, both of which he passed on his own.  He reports symptom onset 4 days ago. He reports intermittent, severe left flank pain and constipation. He denies nausea, vomiting, fevers, chills, and gross hematuria. He has taken nothing at home to relieve the pain.  In-office UA today positive for trace-intact blood; urine microscopy negative.  Additionally, [facility] for an abnormal UA dipstick result from approximately 3 weeks ago.  The letter states that the dip was positive for 2+ blood in his urine.  No microscopy data available from that visit.  PMH: Past Medical History:  Diagnosis Date  . Calculus (=stone) 05/02/2013    Surgical History: Past Surgical History:  Procedure Laterality Date  . NO PAST SURGERIES      Home Medications:  Allergies as of 04/04/2019   No Known Allergies     Medication List       Accurate as of April 04, 2019 11:59 PM. If you have any questions, ask your nurse or doctor.        STOP taking these medications   ibuprofen 800 MG tablet Commonly known as: ADVIL Stopped by: Carman ChingSamantha Tyrese Ficek, PA-C   mirabegron ER 25 MG Tb24 tablet Commonly known as: MYRBETRIQ Stopped by: Carman ChingSamantha Demetrious Rainford, PA-C   naproxen 500 MG 24 hr tablet Commonly known as: NAPRELAN Stopped by: Carman ChingSamantha Talayla Doyel, PA-C   ondansetron 4 MG disintegrating tablet Commonly known as:  ZOFRAN-ODT Stopped by: Carman ChingSamantha Takiah Maiden, PA-C   oxybutynin 5 MG tablet Commonly known as: DITROPAN Stopped by: Carman ChingSamantha Julianna Vanwagner, PA-C       Allergies: No Known Allergies  Family History: History reviewed. No pertinent family history.  Social History:  reports that he has never smoked. He has never used smokeless tobacco. He reports that he does not drink alcohol or use drugs.  ROS: UROLOGY Frequent Urination?: No Hard to postpone urination?: No Burning/pain with urination?: No Get up at night to urinate?: No Leakage of urine?: No Urine stream starts and stops?: No Trouble starting stream?: No Do you have to strain to urinate?: No Blood in urine?: No Urinary tract infection?: No Sexually transmitted disease?: No Injury to kidneys or bladder?: Yes Painful intercourse?: No Weak stream?: No Erection problems?: No Penile pain?: No  Gastrointestinal Nausea?: Yes Vomiting?: No Indigestion/heartburn?: Yes Diarrhea?: No Constipation?: Yes  Constitutional Fever: No Night sweats?: No Weight loss?: No Fatigue?: No  Skin Skin rash/lesions?: No Itching?: No  Eyes Blurred vision?: No Double vision?: No  Ears/Nose/Throat Sore throat?: No Sinus problems?: No  Hematologic/Lymphatic Swollen glands?: No Easy bruising?: No  Cardiovascular Leg swelling?: No Chest pain?: No  Respiratory Cough?: No Shortness of breath?: No  Endocrine Excessive thirst?: No  Musculoskeletal Back pain?: No Joint pain?: No  Neurological Headaches?: No Dizziness?: No  Psychologic Depression?: No Anxiety?: No  Physical Exam: There were no vitals taken for this visit.  Constitutional:  Alert and oriented, No acute distress.  HEENT: [place] AT Cardiovascular: No clubbing, cyanosis, or edema. Respiratory: Normal respiratory effort, no increased work of breathing. GU: No CVA tenderness Skin: No rashes, bruises or suspicious lesions. No diaphoresis. Neurologic: Grossly  intact, no focal deficits, moving all 4 extremities. Psychiatric: Normal mood and affect.  Laboratory Data: Results for orders placed or performed in visit on 04/04/19  Microscopic Examination   URINE  Result Value Ref Range   WBC, UA 0-5 0 - 5 /hpf   RBC None seen 0 - 2 /hpf   Epithelial Cells (non renal) None seen 0 - 10 /hpf   Bacteria, UA None seen None seen/Few  Urinalysis, Complete  Result Value Ref Range   Specific Gravity, UA <1.005 (L) 1.005 - 1.030   pH, UA 6.0 5.0 - 7.5   Color, UA Straw Yellow   Appearance Ur Clear Clear   Leukocytes,UA Negative Negative   Protein,UA Negative Negative/Trace   Glucose, UA Negative Negative   Ketones, UA Negative Negative   RBC, UA Trace (A) Negative   Bilirubin, UA Negative Negative   Urobilinogen, Ur 0.2 0.2 - 1.0 mg/dL   Nitrite, UA Negative Negative   Microscopic Examination See below:    Pertinent Imaging: KUB 04/04/2019: CLINICAL DATA:  Acute left flank pain.  EXAM: ABDOMEN - 1 VIEW  COMPARISON:  None.  FINDINGS: The bowel gas pattern is normal. No radio-opaque calculi or other significant radiographic abnormality are seen.  IMPRESSION: No definite evidence of nephrolithiasis. No evidence of bowel obstruction or ileus.   Electronically Signed   By: Marijo Conception M.D.   On: 04/05/2019 09:19  I personally reviewed the images referenced above and do not visualize a stone either.  Assessment & Plan:   1. Hematuria, unspecified type Patient has a history of microscopic hematuria with negative work-up in 2017.  He denies gross hematuria.  No hematuria per UA microscopy today.  As there is no microscopy data from the urine dipstick he reports having undergone 3 weeks ago, I am uncertain if he had microscopic hematuria at that time either.  I counseled the patient that a true diagnosis of microscopic hematuria depends on microscopic evaluation of the urine sample.  Without this information, I cannot say whether  or not he had microscopic hematuria.  I completed his form for work and stated that he has no current signs of microscopic hematuria. - Urinalysis, Complete  2.  Left flank pain with history of nephrolithiasis Patient appears comfortable today.  No recent imaging available to evaluate for stone burden.  Will order KUB today for evaluate for possible stones.  I counseled him to use OTC analgesics including ibuprofen and Tylenol for pain control while I await his imaging results.  He denies nausea and vomiting, I will defer antiemetics. - KUB  ADDENDUM: KUB with no stones visualized.  I will order a CT stone study for further evaluation of possible nephrolithiasis. -CT Stone study  Debroah Loop, [facility] [address] [place], [place] [address] [phone]

## 2019-04-05 ENCOUNTER — Telehealth: Payer: Self-pay | Admitting: Physician Assistant

## 2019-04-05 ENCOUNTER — Other Ambulatory Visit: Payer: Self-pay | Admitting: Physician Assistant

## 2019-04-05 NOTE — Telephone Encounter (Signed)
I just connected with the patient via telephone interpreter to discuss the results of his KUB.  I explained that no stone was visualized, however that does not mean that there is no stone present.  I explained that stones may be too small or comprised of the wrong material to be visualized on x-ray.  I explained that I would like to proceed with a CT stone study to further evaluate him for possible stone burden given his history of recurrent nephrolithiasis.  He stated that he feels significantly better today.  He believes he may have passed his stone.  He would like to defer CT at this time.  I am agreeable with this plan contingent on him continuing to feel better.  I explained that he needs to contact the office if his pain returns or if he acutely developed fever, chills, nausea, vomiting, or gross hematuria.  At that point, we will proceed with CT.

## 2019-04-05 NOTE — Telephone Encounter (Signed)
I have called the patient twice today with the assistance of the interpreter line to discuss the results of his KUB.  I planned to inform him that I ordered a CT stone study for further evaluation of his possible stone.  He did not answer the phone on either attempt.  We left a voicemail upon the second attempt requesting him to call back to the office when he is available.  I anticipate we will have to return a call when he contacts [place] with the interpreter on the line.

## 2019-05-25 ENCOUNTER — Ambulatory Visit: Payer: 59 | Admitting: Urology

## 2019-05-25 NOTE — Progress Notes (Signed)
05/26/2019 10:44 AM   Douglas Baldwin Douglas Baldwin 1981-02-28 [id number]  CC: Left side pain, hematuria  HPI: Douglas Baldwin is a 38 y.o. male with a history of hematuria, chronic prostatitis and nephrolithiasis who presents today for urinary frequency and dysuria with interpreter services, Douglas Baldwin # [phone].  History of hematuria (high risk) Non-smoker.  CTU in 10/2015 - no urolithiasis.  No evidence of urinary tract obstruction.  Solitary subcentimeter hypodense renal cortical lesion in the left kidney, too small to characterize, probably a benign renal cyst. No overtly suspicious renal cortical masses. No urothelial lesions.  Mild prostatomegaly.  Cystoscopy with Dr. Erlene Quan in 10/2015 was NED.  No reports of gross hematuria.  His UA is negative for microscopic hematuria   Chronic prostatitis Myrbetriq ineffective.  Completed PT in 2016/11/24.  Today, he is having symptoms of frequency x 4-5, urgency, dysuria, nocturia x 1, intermittency, hesitancy and straining to urinate.  He has been having nausea associated with the "dry heaves."  He is having issues with constipation and passing small balls of stool.  He states that he is having dark stools but he has not seen any blood in his stool.  He is having painful ejaculation, but he denies any hematospermia. UA benign.  PVR is 163 mL.    Nephrolithiasis He does have a history of nephrolithiasis in 11/25/06 and November 24, 2012, both of which he passed on his own.  He presented to our office in 03/2019 for left flank pain.  No stone was seen on KUB.  Did not get the CT Renal stone study.  He is not having flank pain at today's visit.  He has not had any passage of fragments.    PMH: Past Medical History:  Diagnosis Date  . Calculus (=stone) 05/02/2013    Surgical History: Past Surgical History:  Procedure Laterality Date  . NO PAST SURGERIES      Home Medications:  Allergies as of 05/26/2019   No Known Allergies     Medication List       Accurate as  of May 26, 2019 10:44 AM. If you have any questions, ask your nurse or doctor.        ondansetron 4 MG tablet Commonly known as: Zofran Take 1 tablet (4 mg total) by mouth every 8 (eight) hours as needed for nausea or vomiting. Started by: [name], PA-C   sulfamethoxazole-trimethoprim 800-160 MG tablet Commonly known as: BACTRIM DS Take 1 tablet by mouth every 12 (twelve) hours. Started by: [name], PA-C   tamsulosin 0.4 MG Caps capsule Commonly known as: FLOMAX Take 1 capsule (0.4 mg total) by mouth daily. Started by: [name], PA-C   TYLENOL PO Take by mouth as needed.       Allergies: No Known Allergies  Family History: No family history on file.  Social History:  reports that he has never smoked. He has never used smokeless tobacco. He reports that he does not drink alcohol or use drugs.  ROS: UROLOGY Frequent Urination?: Yes Hard to postpone urination?: Yes Burning/pain with urination?: Yes Get up at night to urinate?: Yes Leakage of urine?: No Urine stream starts and stops?: Yes Trouble starting stream?: Yes Do you have to strain to urinate?: Yes Blood in urine?: No Urinary tract infection?: No Sexually transmitted disease?: No Injury to kidneys or bladder?: No Painful intercourse?: Yes Weak stream?: No Erection problems?: Yes Penile pain?: Yes  Gastrointestinal Nausea?: Yes Vomiting?: Yes Indigestion/heartburn?: No Diarrhea?: No Constipation?: Yes  Constitutional Fever:  No Night sweats?: No Weight loss?: No Fatigue?: Yes  Skin Skin rash/lesions?: No Itching?: No  Eyes Blurred vision?: Yes Double vision?: Yes  Ears/Nose/Throat Sore throat?: No Sinus problems?: No  Hematologic/Lymphatic Swollen glands?: No Easy bruising?: No  Cardiovascular Leg swelling?: No Chest pain?: No  Respiratory Cough?: No Shortness of breath?: No  Endocrine Excessive thirst?: No  Musculoskeletal Back pain?: Yes Joint  pain?: No  Neurological Headaches?: Yes Dizziness?: Yes  Psychologic Depression?: Yes Anxiety?: Yes  Physical Exam: BP 115/73   Pulse 71   Ht 5\' 8"  (1.727 m)   Wt 177 lb (80.3 kg)   BMI 26.91 kg/m   Constitutional:  Well nourished. Alert and oriented, No acute distress. HEENT: Douglas Baldwin AT, moist mucus membranes.  Trachea midline, no masses. Cardiovascular: No clubbing, cyanosis, or edema. Respiratory: Normal respiratory effort, no increased work of breathing. GI: Abdomen is soft, non tender, non distended, no abdominal masses. Liver and spleen not palpable.  No hernias appreciated.  Stool sample for occult testing is not indicated.   GU: No CVA tenderness.  No bladder fullness or masses.  Patient with circumcised phallus.   Urethral meatus is patent.  No penile discharge. No penile lesions or rashes. Scrotum without lesions, cysts, rashes and/or edema.  Testicles are located scrotally bilaterally. No masses are appreciated in the testicles. Left and right epididymis are normal. Rectal: Patient with  normal sphincter tone. Anus and perineum without scarring or rashes. No rectal masses are appreciated. Prostate is approximately 40 grams, no nodules are appreciated. Painful to palpation.  Seminal vesicles could not be palpated.  Skin: No rashes, bruises or suspicious lesions. Lymph: No inguinal adenopathy. Neurologic: Grossly intact, no focal deficits, moving all 4 extremities. Psychiatric: Normal mood and affect.  Laboratory Data: Results for orders placed or performed in visit on 05/26/19  BLADDER SCAN AMB NON-IMAGING  Result Value Ref Range   Scan Result 05/28/19    Pertinent Imaging: KUB 04/04/2019: CLINICAL DATA:  Acute left flank pain.  EXAM: ABDOMEN - 1 VIEW  COMPARISON:  None.  FINDINGS: The bowel gas pattern is normal. No radio-opaque calculi or other significant radiographic abnormality are seen.  IMPRESSION: No definite evidence of nephrolithiasis. No evidence of  bowel obstruction or ileus.   Electronically Signed   By: 04/06/2019 M.D.   On: 04/05/2019 09:19  Assessment & Plan:    1. Chronic prostatitis UA benign, but will send for culture Will start Bactrim DS and Flomax RTC in two weeks for symptoms recheck and PVR Patient is advised that if they should start to experience pain that is not able to be controlled with pain medication, intractable nausea and/or vomiting and/or fevers greater than 103 or shaking chills to contact the office immediately or seek treatment in the emergency department for emergent intervention.    2. Constipation/Black stools Will refer to GI for further evaluation  3. History of hematuria Hematuria work up completed in 10/2015 - negative No report of gross hematuria  UA today negative for microscopic hematuria RTC in one year for UA - patient to report any gross hematuria in the interim   - Urinalysis, Complete  4. History of nephrolithiasis Recent KUB negative for stones No complaints of renal colic at this visit   11/2015, [facility] [address] [place], [place] [phone]

## 2019-05-26 ENCOUNTER — Ambulatory Visit (INDEPENDENT_AMBULATORY_CARE_PROVIDER_SITE_OTHER): Payer: 59 | Admitting: Urology

## 2019-05-26 ENCOUNTER — Encounter: Payer: Self-pay | Admitting: Urology

## 2019-05-26 ENCOUNTER — Other Ambulatory Visit: Payer: Self-pay

## 2019-05-26 VITALS — BP 115/73 | HR 71 | Ht 68.0 in | Wt 177.0 lb

## 2019-05-26 DIAGNOSIS — Z87448 Personal history of other diseases of urinary system: Secondary | ICD-10-CM

## 2019-05-26 DIAGNOSIS — Z87442 Personal history of urinary calculi: Secondary | ICD-10-CM

## 2019-05-26 DIAGNOSIS — K921 Melena: Secondary | ICD-10-CM | POA: Diagnosis not present

## 2019-05-26 DIAGNOSIS — N411 Chronic prostatitis: Secondary | ICD-10-CM

## 2019-05-26 DIAGNOSIS — K5909 Other constipation: Secondary | ICD-10-CM | POA: Diagnosis not present

## 2019-05-26 DIAGNOSIS — R300 Dysuria: Secondary | ICD-10-CM | POA: Diagnosis not present

## 2019-05-26 LAB — MICROSCOPIC EXAMINATION: WBC, UA: NONE SEEN /hpf (ref 0–5)

## 2019-05-26 LAB — URINALYSIS, COMPLETE
Bilirubin, UA: NEGATIVE
Glucose, UA: NEGATIVE
Ketones, UA: NEGATIVE
Leukocytes,UA: NEGATIVE
Nitrite, UA: NEGATIVE
Protein,UA: NEGATIVE
Specific Gravity, UA: 1.02 (ref 1.005–1.030)
Urobilinogen, Ur: 0.2 mg/dL (ref 0.2–1.0)
pH, UA: 8.5 — ABNORMAL HIGH (ref 5.0–7.5)

## 2019-05-26 LAB — BLADDER SCAN AMB NON-IMAGING

## 2019-05-26 MED ORDER — SULFAMETHOXAZOLE-TRIMETHOPRIM 800-160 MG PO TABS: 1.0000 | ORAL_TABLET | Freq: Two times a day (BID) | ORAL | 0 refills | Status: DC

## 2019-05-26 MED ORDER — ONDANSETRON HCL 4 MG PO TABS: 4.0000 mg | ORAL_TABLET | Freq: Three times a day (TID) | ORAL | 0 refills | Status: DC | PRN

## 2019-05-26 MED ORDER — TAMSULOSIN HCL 0.4 MG PO CAPS: 0.4000 mg | ORAL_CAPSULE | Freq: Every day | ORAL | 3 refills | Status: DC

## 2019-05-29 LAB — CULTURE, URINE COMPREHENSIVE

## 2019-05-30 ENCOUNTER — Other Ambulatory Visit: Payer: Self-pay

## 2019-05-30 ENCOUNTER — Encounter: Payer: Self-pay | Admitting: Gastroenterology

## 2019-05-30 ENCOUNTER — Ambulatory Visit (INDEPENDENT_AMBULATORY_CARE_PROVIDER_SITE_OTHER): Payer: 59 | Admitting: Gastroenterology

## 2019-05-30 VITALS — BP 129/65 | HR 84 | Temp 98.5°F | Ht 66.0 in | Wt 176.1 lb

## 2019-05-30 DIAGNOSIS — R1013 Epigastric pain: Secondary | ICD-10-CM

## 2019-05-30 DIAGNOSIS — R194 Change in bowel habit: Secondary | ICD-10-CM | POA: Diagnosis not present

## 2019-05-30 MED ORDER — POLYETHYLENE GLYCOL 3350 17 G PO PACK: 17.0000 g | PACK | Freq: Every day | ORAL | 1 refills | Status: DC

## 2019-05-30 NOTE — Patient Instructions (Signed)
Dieta rica en fibra High-Fiber Diet La fibra, tambin llamada fibra dietaria, es un tipo de carbohidrato que se encuentra en las frutas, las verduras, los cereales integrales y los frijoles. Una dieta rica en fibra puede tener muchos beneficios para la salud. El mdico puede recomendar una dieta rica en fibra para ayudar a:  Evitar el estreimiento. La fibra puede hacer que defeque con ms frecuencia.  Disminuir el nivel de colesterol.  Aliviar las siguientes afecciones: ? Hinchazn de la venas en el ano (hemorroides). ? Hinchazn e irritacin (inflamacin) de zonas especficas del tracto digestivo (diverticulitis sin complicaciones). ? Un problema del intestino grueso (colon) que, a veces, causa dolor y diarrea (sndrome de colon irritable, SCI).  Evitar comer en exceso como parte de un plan para bajar de peso.  Evitar la enfermedad cardaca, la diabetes tipo 2 y ciertos cnceres. En qu consiste el plan? El consumo diario recomendado de fibra en gramos (g) incluye lo siguiente:  38g para hombres de 50 aos o menos.  30g para los hombres mayores de 50 aos.  25g para mujeres de 50 aos o menos.  21g para las mujeres mayores de 50 aos. Puede recibir la ingesta diaria recomendada de fibra dietaria de la siguiente manera:  Coma una variedad de frutas, verduras, granos y frijoles.  Tome un suplemento de fibra, si no es posible comer suficiente fibra en su dieta. Qu debo saber acerca de la dieta rica en fibra?  Es mejor obtener fibra directamente de los alimentos en lugar de recibirla de suplementos de fibra. No hay demasiada investigacin acerca de qu tan efectivos son los suplementos.  Verifique siempre el contenido de fibra en la etiqueta de informacin nutricional de los alimentos preenvasados. Busque alimentos que contengan 5g o ms de fibra por porcin.  Hable con un especialista en alimentacin y nutricin (nutricionista) si tiene preguntas sobre alimentos  especficos que se recomiendan o no para su afeccin mdica, especialmente si aquellos alimentos no estn detallados a continuacin.  Aumente gradualmente la cantidad de fibra que consume. Si aumenta demasiado rpido el consumo de fibra dietaria puede provocar distensin abdominal, clicos o gases.  Beba abundante agua. El agua ayuda a digerir la fibra. Cules son algunos consejos para seguir este plan?  Consuma una gran variedad de alimentos ricos en fibra.  Asegrese de que al menos la mitad de los granos que ingiere cada da sean cereales integrales.  Coma panes y cereales hechos con harina de cereales integrales en lugar de harina refinada o blanca.  Coma arroz integral, trigo burgol o mijo en lugar de arroz blanco.  Comience el da con un desayuno rico en fibra, como un cereal que contenga al menos 5g de fibra o ms por porcin.  Use frijoles en lugar de carne en las sopas, ensaladas o platos de pastas.  Coma colaciones ricas en fibra, como frutos rojos, verduras crudas, frutos secos y palomitas de maz.  Elija frutas y verduras enteras en lugar de formas procesadas como jugo o salsa. Qu alimentos puedo comer?  Frutas Frutos rojos. Peras. Manzanas. Naranjas. Aguacate. Ciruelas y pasas. Higos secos. Verduras Batatas. Espinaca. Col rizada. Alcachofas. Repollo. Brcoli. Coliflor. Guisantes. Zanahorias. Calabaza. Cereales Panes integrales. Cereal multigrano. Avena. Arroz integral. Cebada. Trigo burgol. Mijo. Quinua. Magdalenas de salvado. Palomitas de maz. Galletas de centeno. Carnes y otras protenas Frijoles blancos, colorados y pintos. Soja. Guisantes secos. Lentejas. Frutos secos y semillas. Lcteos Yogur fortificado con fibra. Bebidas Leche de soja fortificada con fibra. Jugo de naranja fortificado con   fibra. Otros alimentos Barras de fibra. Es posible que los productos mencionados arriba no formen una lista completa de las bebidas y los alimentos recomendados.  Comunquese con un nutricionista para conocer ms opciones. Qu alimentos no se recomiendan? Frutas Jugo de frutas. Frutas cocidas coladas. Verduras Papas fritas. Verduras enlatadas. Verduras muy cocidas. Cereales Pan blanco. Pastas hechas con harina refinada. Arroz blanco. Carnes y otras protenas Cortes de carne con grasa. Pollo o pescado fritos. Lcteos Leche. Yogur. Queso crema. Crema cida. Grasas y aceites Mantequillas. Bebidas Gaseosas. Otros alimentos Tortas y pasteles. Es posible que los productos que se enumeran ms arriba no sean una lista completa de los alimentos y las bebidas que se deben evitar. Comunquese con un nutricionista para obtener ms informacin. Resumen  La fibra es un tipo de carbohidrato. Se encuentra en las frutas, las verduras, los cereales integrales y los frijoles.  Una dieta rica en fibra representa muchos beneficios para la salud, como evitar el estreimiento, bajar el colesterol en la sangre, ayudar a perder peso y reducir el riesgo de enfermedad cardaca, diabetes y ciertos tipos de cncer.  Aumente gradualmente la ingesta de fibra. El aumento demasiado rpido puede provocar clicos, distensin abdominal y gases. Beba mucha agua a la vez que aumenta la fibra.  Las mejores fuentes de fibra incluyen frutas y verduras enteras, granos integrales, frutos secos, semillas y frijoles. Esta informacin no tiene como fin reemplazar el consejo del mdico. Asegrese de hacerle al mdico cualquier pregunta que tenga. Document Released: 07/27/2005 Document Revised: 11/06/2017 Document Reviewed: 07/16/2017 Elsevier Patient Education  2020 [employer]  

## 2019-05-30 NOTE — Progress Notes (Signed)
Douglas Baldwin [address]  [place]  [place], [place] [address]  Main: [phone]  Fax: [phone]   Gastroenterology Consultation  Referring Provider:     Laneta Baldwin Primary Care Physician:  [facility] Reason for Consultation:     Black stool        HPI:    Chief Complaint  Patient presents with  . New Patient (Initial Visit)  . Abdominal Pain    Patient has had some lower abdominal pain and some nausea     Douglas Baldwin is Baldwin 38 y.o. y/o male referred for consultation & management  by Dr. Domingo Baldwin, [facility].  Patient with history of chronic prostatitis, referred to [place] from urology clinic due to altered bowel habits and black stool.  Patient evaluated with Spanish interpreter, and he reports 1 year history of altered bowel habits.  Describes these to be 3-4 small bowel movements all in the morning, without any loose stools.  Denies constipation or straining, however is frustrated with having to blow multiple times Baldwin day within 15 minutes of each other.  He has also noticed bright red blood on tissue paper only after going multiple times.  No blood in stool itself.  States stool color varies between dark brown, yellow, 2 black.  However has not noticed true melena, as far as liquidy black stool or foul-smelling stool.  No NSAID use.  No nausea or vomiting, no dysphagia.  Does report abdominal bloating and midepigastric pain after eating intermittently.  No weight loss.  No prior EGD or colonoscopy.  No family history of colon cancer.  Has also had other antibiotics during the year for ear infections and other episodes of prostatitis.  Past Medical History:  Diagnosis Date  . Calculus (=stone) 05/02/2013    Past Surgical History:  Procedure Laterality Date  . NO PAST SURGERIES      Prior to Admission medications   Medication Sig Start Date End Date Taking? Authorizing Provider  Acetaminophen (TYLENOL PO) Take  by mouth as needed.   Yes [provider]  tamsulosin (FLOMAX) 0.4 MG CAPS capsule Take 1 capsule (0.4 mg total) by mouth daily. 05/26/19  Yes McGowan, Larene Beach A, PA-C  polyethylene glycol (MIRALAX) 17 g packet Take 17 g by mouth daily. 05/30/19   Virgel Manifold, MD    History reviewed. No pertinent family history.   Social History   Tobacco Use  . Smoking status: Never Smoker  . Smokeless tobacco: Never Used  Substance Use Topics  . Alcohol use: No    Alcohol/week: 0.0 standard drinks  . Drug use: No    Allergies as of 05/30/2019  . (No Known Allergies)    Review of Systems:    All systems reviewed and negative except where noted in HPI.   Physical Exam:  BP 129/65 (BP Location: Left Arm, Patient Position: Sitting, Cuff Size: Normal)   Pulse 84   Temp 98.5 F (36.9 C) (Oral)   Ht 5\' 6"  (1.676 m)   Wt 176 lb 2 oz (79.9 kg)   BMI 28.43 kg/m  No LMP for male patient. Psych:  Alert and cooperative. Normal mood and affect. General:   Alert,  Well-developed, well-nourished, pleasant and cooperative in NAD Head:  Normocephalic and atraumatic. Eyes:  Sclera clear, no icterus.   Conjunctiva pink. Ears:  Normal auditory acuity. Nose:  No deformity, discharge, or lesions. Mouth:  No deformity or lesions,oropharynx pink & moist. Neck:  Supple; no masses or thyromegaly. Abdomen:  Normal bowel sounds.  No bruits.  Soft, non-tender and non-distended without masses, hepatosplenomegaly or hernias noted.  No guarding or rebound tenderness.    Msk:  Symmetrical without gross deformities. Good, equal movement & strength bilaterally. Pulses:  Normal pulses noted. Extremities:  No clubbing or edema.  No cyanosis. Neurologic:  Alert and oriented x3;  grossly normal neurologically. Skin:  Intact without significant lesions or rashes. No jaundice. Lymph Nodes:  No significant cervical adenopathy. Psych:  Alert and cooperative. Normal mood and affect.   Labs: CBC No  results found for: WBC, RBC, HGB, HCT, PLT, MCV, MCH, MCHC, RDW, LYMPHSABS, MONOABS, EOSABS, BASOSABS CMP     Component Value Date/Time   NA 140 10/18/2015 1622   K 4.1 10/18/2015 1622   CL 100 10/18/2015 1622   CO2 20 10/18/2015 1622   GLUCOSE 74 10/18/2015 1622   BUN 15 10/18/2015 1622   CREATININE 0.84 10/18/2015 1622   CALCIUM 9.1 10/18/2015 1622   GFRNONAA 113 10/18/2015 1622   GFRAA 131 10/18/2015 1622    Imaging Studies: No results found.  Assessment and Plan:   Douglas Baldwin is Baldwin 38 y.o. y/o male has been referred for altered bowel habits midepigastric pain  Patient does not have any alarm symptoms present Currently undergoing treatment with antibiotics for prostatitis  Would defer any endoscopies until prostatitis treatment at this time is complete  His intermittent bright red blood on tissue paper only is likely due to underlying hemorrhoids and irritation from going multiple times in the morning.  Altered bowel habits may be due to needing multiple other antibiotics throughout the year.  You may also have underlying constipation rating to small bowel movements instead of full formed bowel movements 1-2 times Baldwin day.  High-fiber diet encouraged MiraLAX daily  We will also obtain H. pylori serology due to midepigastric bloating and intermittent epigastric pain  If symptoms do not get better in 6 to 8 weeks, can consider endoscopy  Patient agreeable with above plan  No diarrhea present to suspect infectious causes at this time  Dr Melodie Bouillon  Speech recognition software was used to dictate the above note.

## 2019-06-01 ENCOUNTER — Telehealth: Payer: Self-pay

## 2019-06-01 LAB — H. PYLORI BREATH TEST: H pylori Breath Test: NEGATIVE

## 2019-06-01 NOTE — Telephone Encounter (Signed)
Patient verbalized understanding  

## 2019-06-01 NOTE — Telephone Encounter (Signed)
-----   Message from Virgel Manifold, MD sent at 06/01/2019 11:30 AM EDT ----- [name] please let the patient know, his h pylori test was negative

## 2019-06-08 NOTE — Progress Notes (Signed)
06/09/2019 9:34 AM   Douglas DarbyElias Baldwin Douglas Baldwin [birth year] [id number]  CC: Left side pain, hematuria  HPI: Douglas Baldwin is a 38 y.o. male with a history of hematuria, chronic prostatitis and nephrolithiasis who presents today for urinary frequency and dysuria with interpreter, [name]    History of hematuria (high risk) Non-smoker.  CTU in 10/2015 - no urolithiasis.  No evidence of urinary tract obstruction.  Solitary subcentimeter hypodense renal cortical lesion in the left kidney, too small to characterize, probably a benign renal cyst. No overtly suspicious renal cortical masses. No urothelial lesions.  Mild prostatomegaly.  Cystoscopy with Dr. [name] in 10/2015 was NED.  No reports of gross hematuria.  His UA from 05/26/2019 was negative.    Chronic prostatitis Myrbetriq ineffective.  Completed PT in 2018.  Today, he is having symptoms of frequency x 4-5, urgency, dysuria, nocturia x 1, intermittency, hesitancy and straining to urinate.  He has been having nausea associated with the "dry heaves."  He is having issues with constipation and passing small balls of stool.  He states that he is having dark stools but he has not seen any blood in his stool.  He is having painful ejaculation, but he denies any hematospermia. UA benign.  PVR is 163 mL.  He has seen GI, Dr. [name], and H. Pylori was negative and he was encouraged to take MiraLax and have a high fiber diet.  They may consider endoscopy if no improvement in 6-8 weeks.    He states he felt somewhat better taking the Septra DS, but he has since completed the antibiotic and his symptoms are returning.  He still continues to have dysuria and urgency which is relieved by voiding, but the symptoms returned a few minutes after he has completed urination.  His urine culture from last visit was positive for MUF.    He is also complaining of dizziness and has been diagnosed with an ear infection x2 through his primary care offices.  He has  been on 2 rounds of Augmentin over the summer for his ear infection.  He states that they instructed him to contact them if he should experience more symptoms and he has, but he states they have not returned his phone call.  He is able to have satisfactory intercourse, but he has some slight pain with ejaculation.  He states he is a monogamous relationship with his wife and does not use condoms.  His UA today is positive for a trace blood, 0-5 WBC's, 0-2 RBC's, 0-10 Epithelial Cells and 0-10 Renal epithelial cells.     His bladder scan is 17 mL.    Nephrolithiasis He does have a history of nephrolithiasis in 2008 and 2014, both of which he passed on his own.  He presented to our office in 03/2019 for left flank pain.  No stone was seen on KUB.  Did not get the CT Renal stone study.  He is not having flank pain at today's visit.  He has not had any passage of fragments.    PMH: Past Medical History:  Diagnosis Date  . Calculus (=stone) 05/02/2013    Surgical History: Past Surgical History:  Procedure Laterality Date  . NO PAST SURGERIES      Home Medications:  Allergies as of 06/09/2019   No Known Allergies     Medication List       Accurate as of June 09, 2019 11:59 PM. If you have any questions, ask your nurse or doctor.  doxycycline 100 MG capsule Commonly known as: VIBRAMYCIN Take 1 capsule (100 mg total) by mouth every 12 (twelve) hours. Started by: [name], PA-C   polyethylene glycol 17 g packet Commonly known as: MiraLax Take 17 g by mouth daily.   tamsulosin 0.4 MG Caps capsule Commonly known as: FLOMAX Take 1 capsule (0.4 mg total) by mouth daily.   TYLENOL PO Take by mouth as needed.       Allergies: No Known Allergies  Family History: No family history on file.  Social History:  reports that he has never smoked. He has never used smokeless tobacco. He reports that he does not drink alcohol or use drugs.  ROS: UROLOGY Frequent  Urination?: Yes Hard to postpone urination?: No Burning/pain with urination?: Yes Get up at night to urinate?: No Leakage of urine?: No Urine stream starts and stops?: No Trouble starting stream?: No Do you have to strain to urinate?: No Blood in urine?: No Urinary tract infection?: No Sexually transmitted disease?: No Injury to kidneys or bladder?: No Painful intercourse?: No Weak stream?: No Erection problems?: No Penile pain?: No  Gastrointestinal Nausea?: Yes Vomiting?: Yes Indigestion/heartburn?: No Diarrhea?: No Constipation?: No  Constitutional Fever: No Night sweats?: No Weight loss?: No Fatigue?: Yes  Skin Skin rash/lesions?: No Itching?: No  Eyes Blurred vision?: Yes Double vision?: No  Ears/Nose/Throat Sore throat?: No Sinus problems?: No  Hematologic/Lymphatic Swollen glands?: No Easy bruising?: No  Cardiovascular Leg swelling?: No Chest pain?: No  Respiratory Cough?: No Shortness of breath?: No  Endocrine Excessive thirst?: No  Musculoskeletal Back pain?: No Joint pain?: No  Neurological Headaches?: No Dizziness?: Yes  Psychologic Depression?: No Anxiety?: Yes  Physical Exam: BP 126/76   Pulse 76   Ht 5\' 6"  (1.676 m)   Wt 177 lb (80.3 kg)   BMI 28.57 kg/m   Constitutional:  Well nourished. Alert and oriented, No acute distress. HEENT: [place] AT, moist mucus membranes.  Trachea midline, no masses. Cardiovascular: No clubbing, cyanosis, or edema. Respiratory: Normal respiratory effort, no increased work of breathing. Neurologic: Grossly intact, no focal deficits, moving all 4 extremities. Psychiatric: Normal mood and affect.  Laboratory Data: Results for orders placed or performed in visit on 06/09/19  GC/Chlamydia Probe Amp([facility])   Specimen: Urine   UR  Result Value Ref Range   Chlamydia trachomatis, NAA Negative Negative   Neisseria Gonorrhoeae by PCR Negative Negative  Microscopic Examination   URINE  Result  Value Ref Range   WBC, UA 0-5 0 - 5 /hpf   RBC 0-2 0 - 2 /hpf   Epithelial Cells (non renal) 0-10 0 - 10 /hpf   Renal Epithel, UA 0-10 (A) None seen /hpf   Bacteria, UA None seen None seen/Few  Urinalysis, Complete  Result Value Ref Range   Specific Gravity, UA 1.015 1.005 - 1.030   pH, UA 8.5 (H) 5.0 - 7.5   Color, UA Yellow Yellow   Appearance Ur Clear Clear   Leukocytes,UA Negative Negative   Protein,UA Negative Negative/Trace   Glucose, UA Negative Negative   Ketones, UA Negative Negative   RBC, UA Trace (A) Negative   Bilirubin, UA Negative Negative   Urobilinogen, Ur 0.2 0.2 - 1.0 mg/dL   Nitrite, UA Negative Negative   Microscopic Examination See below:   Genital Mycoplasmas NAA, Urine  Result Value Ref Range   Mycoplasma genitalium NAA Negative Negative   Mycoplasma hominis NAA Negative Negative   Ureaplasma spp NAA Negative Negative  BLADDER SCAN AMB  NON-IMAGING  Result Value Ref Range   Scan Result 37mL    Pertinent Imaging: KUB 04/04/2019: CLINICAL DATA:  Acute left flank pain.  EXAM: ABDOMEN - 1 VIEW  COMPARISON:  None.  FINDINGS: The bowel gas pattern is normal. No radio-opaque calculi or other significant radiographic abnormality are seen.  IMPRESSION: No definite evidence of nephrolithiasis. No evidence of bowel obstruction or ileus.   Electronically Signed   By: Lupita Raider M.D.   On: 04/05/2019 09:19  Assessment & Plan:    1. Chronic prostatitis UA benign, urine culture negative, gonorrhea and chlamydia negative Start doxycycline 100 mg BID for 30 days RTC in one month for symptom recheck  Patient is advised that if they should start to experience pain that is not able to be controlled with pain medication, intractable nausea and/or vomiting and/or fevers greater than 103 or shaking chills to contact the office immediately or seek treatment in the emergency department for emergent intervention.    2. Constipation/Black stools Seen  by GI - recommendations as in HPI   3. History of hematuria Hematuria work up completed in 10/2015 - negative No report of gross hematuria  UA today negative for [facility] RTC in one year for UA - patient to report any gross hematuria in the interim   - Urinalysis, Complete  4. History of nephrolithiasis Recent KUB negative for stones  5. Recurrent ear infections Patient states he was told that if he continued to have ear infections that he would be referred to a specialist.  He has contacted his PCP's office and has not heard back from them so I have gone ahead and made the referral.    Michiel Cowboy, [facility]  [facility] [address], [address] [place], [place] [address] [phone]

## 2019-06-09 ENCOUNTER — Ambulatory Visit (INDEPENDENT_AMBULATORY_CARE_PROVIDER_SITE_OTHER): Payer: 59 | Admitting: Urology

## 2019-06-09 ENCOUNTER — Encounter: Payer: Self-pay | Admitting: Urology

## 2019-06-09 ENCOUNTER — Other Ambulatory Visit: Payer: Self-pay

## 2019-06-09 VITALS — BP 126/76 | HR 76 | Ht 66.0 in | Wt 177.0 lb

## 2019-06-09 DIAGNOSIS — N411 Chronic prostatitis: Secondary | ICD-10-CM

## 2019-06-09 DIAGNOSIS — H6690 Otitis media, unspecified, unspecified ear: Secondary | ICD-10-CM

## 2019-06-09 LAB — URINALYSIS, COMPLETE
Bilirubin, UA: NEGATIVE
Glucose, UA: NEGATIVE
Ketones, UA: NEGATIVE
Leukocytes,UA: NEGATIVE
Nitrite, UA: NEGATIVE
Protein,UA: NEGATIVE
Specific Gravity, UA: 1.015 (ref 1.005–1.030)
Urobilinogen, Ur: 0.2 mg/dL (ref 0.2–1.0)
pH, UA: 8.5 — ABNORMAL HIGH (ref 5.0–7.5)

## 2019-06-09 LAB — MICROSCOPIC EXAMINATION: Bacteria, UA: NONE SEEN

## 2019-06-09 LAB — BLADDER SCAN AMB NON-IMAGING

## 2019-06-09 MED ORDER — DOXYCYCLINE HYCLATE 100 MG PO CAPS: 100.0000 mg | ORAL_CAPSULE | Freq: Two times a day (BID) | ORAL | 1 refills | Status: DC

## 2019-06-13 LAB — GC/CHLAMYDIA PROBE AMP
Chlamydia trachomatis, NAA: NEGATIVE
Neisseria Gonorrhoeae by PCR: NEGATIVE

## 2019-06-14 ENCOUNTER — Telehealth: Payer: Self-pay

## 2019-06-14 LAB — GENITAL MYCOPLASMAS NAA, URINE
Mycoplasma genitalium NAA: NEGATIVE
Mycoplasma hominis NAA: NEGATIVE
Ureaplasma spp NAA: NEGATIVE

## 2019-06-14 NOTE — Telephone Encounter (Signed)
Interpreter left detailed message for patient regarding results.

## 2019-06-14 NOTE — Telephone Encounter (Signed)
-----   Message from Nori Riis, PA-C sent at 06/14/2019  8:22 AM EST ----- Please let Mr. [name] know that his other urine cultures were negative.  He needs to continue the doxycycline and follow up in one month.

## 2019-06-20 ENCOUNTER — Other Ambulatory Visit: Payer: Self-pay | Admitting: Urology

## 2019-06-20 ENCOUNTER — Telehealth: Payer: Self-pay | Admitting: Urology

## 2019-06-20 DIAGNOSIS — R109 Unspecified abdominal pain: Secondary | ICD-10-CM

## 2019-06-20 NOTE — Progress Notes (Unsigned)
I have ordered this CT scan STAT for him.  He is not in the office, so we will have to call him.

## 2019-06-20 NOTE — Telephone Encounter (Signed)
PA is pending with [employer] I have faxed the clinicals   [place]

## 2019-06-21 NOTE — Telephone Encounter (Signed)
His CT scan was approved and he is scheduled for tomorrow in [facility] @ 11:00    Sharyn Lull

## 2019-06-22 ENCOUNTER — Other Ambulatory Visit: Payer: Self-pay

## 2019-06-22 ENCOUNTER — Ambulatory Visit
Admission: RE | Admit: 2019-06-22 | Discharge: 2019-06-22 | Disposition: A | Discharge status: Home / Self Care | Payer: 59 | Source: Ambulatory Visit | Attending: Urology | Admitting: Urology

## 2019-06-22 ENCOUNTER — Telehealth: Payer: Self-pay | Admitting: Family Medicine

## 2019-06-22 DIAGNOSIS — R109 Unspecified abdominal pain: Secondary | ICD-10-CM | POA: Insufficient documentation

## 2019-06-22 NOTE — Telephone Encounter (Signed)
-----   Message from Nori Riis, PA-C sent at 06/22/2019  1:15 PM EST ----- Please let Mr. [name] know that his CT scan is normal and we will see him on 12/02

## 2019-06-22 NOTE — Telephone Encounter (Signed)
LMOM for patient to return call.

## 2019-06-27 NOTE — Telephone Encounter (Signed)
Patient notified by Interpreter services.

## 2019-07-02 ENCOUNTER — Other Ambulatory Visit: Payer: Self-pay

## 2019-07-02 ENCOUNTER — Encounter: Payer: Self-pay | Admitting: Emergency Medicine

## 2019-07-02 DIAGNOSIS — R42 Dizziness and giddiness: Secondary | ICD-10-CM | POA: Insufficient documentation

## 2019-07-02 DIAGNOSIS — R110 Nausea: Secondary | ICD-10-CM | POA: Insufficient documentation

## 2019-07-02 DIAGNOSIS — R300 Dysuria: Secondary | ICD-10-CM | POA: Diagnosis not present

## 2019-07-02 DIAGNOSIS — Z79899 Other long term (current) drug therapy: Secondary | ICD-10-CM | POA: Diagnosis not present

## 2019-07-02 DIAGNOSIS — R1030 Lower abdominal pain, unspecified: Secondary | ICD-10-CM | POA: Insufficient documentation

## 2019-07-02 LAB — CBC
HCT: 44 % (ref 39.0–52.0)
Hemoglobin: 15.2 g/dL (ref 13.0–17.0)
MCH: 30.7 pg (ref 26.0–34.0)
MCHC: 34.5 g/dL (ref 30.0–36.0)
MCV: 88.9 fL (ref 80.0–100.0)
Platelets: 400 10*3/uL (ref 150–400)
RBC: 4.95 MIL/uL (ref 4.22–5.81)
RDW: 11.9 % (ref 11.5–15.5)
WBC: 11.1 10*3/uL — ABNORMAL HIGH (ref 4.0–10.5)
nRBC: 0 % (ref 0.0–0.2)

## 2019-07-02 LAB — COMPREHENSIVE METABOLIC PANEL
ALT: 27 U/L (ref 0–44)
AST: 21 U/L (ref 15–41)
Albumin: 4.9 g/dL (ref 3.5–5.0)
Alkaline Phosphatase: 62 U/L (ref 38–126)
Anion gap: 11 (ref 5–15)
BUN: 11 mg/dL (ref 6–20)
CO2: 24 mmol/L (ref 22–32)
Calcium: 9.9 mg/dL (ref 8.9–10.3)
Chloride: 104 mmol/L (ref 98–111)
Creatinine, Ser: 0.89 mg/dL (ref 0.61–1.24)
GFR calc Af Amer: >60 mL/min (ref >60)
GFR calc non Af Amer: >60 mL/min (ref >60)
Glucose, Bld: 101 mg/dL — ABNORMAL HIGH (ref 70–99)
Potassium: 3.7 mmol/L (ref 3.5–5.1)
Sodium: 139 mmol/L (ref 135–145)
Total Bilirubin: 0.9 mg/dL (ref 0.3–1.2)
Total Protein: 8 g/dL (ref 6.5–8.1)

## 2019-07-02 LAB — URINALYSIS, COMPLETE (UACMP) WITH MICROSCOPIC
Bacteria, UA: NONE SEEN
Bilirubin Urine: NEGATIVE
Glucose, UA: NEGATIVE mg/dL
Ketones, ur: NEGATIVE mg/dL
Leukocytes,Ua: NEGATIVE
Nitrite: NEGATIVE
Protein, ur: NEGATIVE mg/dL
Specific Gravity, Urine: 1.005 (ref 1.005–1.030)
Squamous Epithelial / HPF: NONE SEEN (ref 0–5)
pH: 7 (ref 5.0–8.0)

## 2019-07-02 LAB — LIPASE, BLOOD: Lipase: 29 U/L (ref 11–51)

## 2019-07-02 NOTE — ED Notes (Signed)
First Nurse Note: Pt to ED via [facility] for abd pain x a few months, has been on abx but isn't getting any better. VSS per EMS.

## 2019-07-02 NOTE — ED Triage Notes (Signed)
Triage assessment completed via Face to Face Spanish interpreter.  Pt arrived via [facility] from  Home with reports of 3 months of nausea and weakness, pt reports he has been seen by urology a few weeks ago, started on doxycycline BID x 30 days.  Pt reports decreased appetite, recently started on anitdepressant by PCP.  Pt had CT renal stone study on 11/12.  Pt states he had recent UTI and pain with urination, but now reports he feels a lump and has pressure in lower abdomen when sitting.

## 2019-07-03 ENCOUNTER — Emergency Department: Payer: 59

## 2019-07-03 ENCOUNTER — Emergency Department
Admission: EM | Admit: 2019-07-03 | Discharge: 2019-07-03 | Disposition: A | Discharge status: Home / Self Care | Payer: 59 | Attending: Emergency Medicine | Admitting: Emergency Medicine

## 2019-07-03 ENCOUNTER — Encounter: Payer: Self-pay | Admitting: Radiology

## 2019-07-03 DIAGNOSIS — R300 Dysuria: Secondary | ICD-10-CM

## 2019-07-03 DIAGNOSIS — R110 Nausea: Secondary | ICD-10-CM

## 2019-07-03 DIAGNOSIS — R42 Dizziness and giddiness: Secondary | ICD-10-CM

## 2019-07-03 HISTORY — DX: Chronic prostatitis: N41.1

## 2019-07-03 MED ORDER — ONDANSETRON HCL 4 MG PO TABS: 4.0000 mg | ORAL_TABLET | Freq: Three times a day (TID) | ORAL | 0 refills | Status: DC | PRN

## 2019-07-03 MED ORDER — ONDANSETRON 4 MG PO TBDP
4.0000 mg | ORAL_TABLET | Freq: Once | ORAL | Status: AC
  Administered 2019-07-03: 4 mg via ORAL
  Filled 2019-07-03: qty 1

## 2019-07-03 MED ORDER — IOHEXOL 300 MG/ML  SOLN
100.0000 mL | Freq: Once | INTRAMUSCULAR | Status: AC | PRN
  Administered 2019-07-03: 100 mL via INTRAVENOUS

## 2019-07-03 NOTE — ED Provider Notes (Signed)
[facility] Emergency Department Provider Note  ____________________________________________   I have reviewed the triage vital signs and the nursing notes.   HISTORY  Chief Complaint Nausea and Abdominal Pain (lower)   History limited by: Language [facility] Interpreter utilized   HPI Douglas Baldwin is a 38 y.o. male who presents to the emergency department today with primary complaint of nausea and dizziness. The patient states that his symptoms have been going on for months. It has been accompanied by some abdominal discomfort and decreased appetite. Additionally the patient has complaints of urinary discomfort. He says that he has had the discomfort on and off for the past couple of years, but for the past three months it has been worse. He has seen both urology and GI during this time. Has been placed on antibiotics for urinary infection. He says that his PCP started him on antidepressants 4 days ago but he has not seen any improvement.   Records reviewed. Per medical record review patient has a history of being treated for chronic prostatitis by urology. Saw GI last month.   Past Medical History:  Diagnosis Date  . Calculus (=stone) 05/02/2013  . Chronic prostatitis     Patient Active Problem List   Diagnosis Date Noted  . Calculus (=stone) 05/02/2013  . Urolithiasis 05/02/2013    Past Surgical History:  Procedure Laterality Date  . NO PAST SURGERIES      Prior to Admission medications   Medication Sig Start Date End Date Taking? Authorizing Provider  Acetaminophen (TYLENOL PO) Take by mouth as needed.    [provider]  doxycycline (VIBRAMYCIN) 100 MG capsule Take 1 capsule (100 mg total) by mouth every 12 (twelve) hours. 06/09/19   Michiel Cowboy A, PA-C  polyethylene glycol (MIRALAX) 17 g packet Take 17 g by mouth daily. 05/30/19   Pasty Spillers, MD  tamsulosin (FLOMAX) 0.4 MG CAPS capsule Take 1 capsule (0.4 mg  total) by mouth daily. 05/26/19   Michiel Cowboy A, PA-C    Allergies Patient has no known allergies.  No family history on file.  Social History Social History   Tobacco Use  . Smoking status: Never Smoker  . Smokeless tobacco: Never Used  Substance Use Topics  . Alcohol use: No    Alcohol/week: 0.0 standard drinks  . Drug use: No    Review of Systems Constitutional: No fever/chills Eyes: No visual changes. ENT: No sore throat. Cardiovascular: Denies chest pain. Respiratory: Denies shortness of breath. Gastrointestinal: Positive for nausea.  Genitourinary: Positive for dysuria Musculoskeletal: Negative for back pain. Skin: Negative for rash. Neurological: Positive for dizziness ____________________________________________   PHYSICAL EXAM:  VITAL SIGNS: ED Triage Vitals  Enc Vitals Group     BP 07/02/19 1835 133/76     Pulse Rate 07/02/19 1835 69     Resp 07/02/19 1835 18     Temp 07/02/19 1835 98.6 F (37 C)     Temp Source 07/02/19 1835 Oral     SpO2 07/02/19 1835 99 %     Weight 07/02/19 1848 177 lb (80.3 kg)     Height 07/02/19 1848 5\' 2"  (1.575 m)     Head Circumference --      Peak Flow --      Pain Score 07/02/19 1849 5    Constitutional: Alert and oriented.  Eyes: Conjunctivae are normal.  ENT      Head: Normocephalic and atraumatic.      Nose: No congestion/rhinnorhea.  Mouth/Throat: Mucous membranes are moist.      Neck: No stridor. Hematological/Lymphatic/Immunilogical: No cervical lymphadenopathy. Cardiovascular: Normal rate, regular rhythm.  No murmurs, rubs, or gallops.  Respiratory: Normal respiratory effort without tachypnea nor retractions. Breath sounds are clear and equal bilaterally. No wheezes/rales/rhonchi. Gastrointestinal: Soft and non tender. No rebound. No guarding.  Genitourinary: Deferred Musculoskeletal: Normal range of motion in all extremities. No lower extremity edema. Neurologic:  Normal speech and language. No  gross focal neurologic deficits are appreciated.  Skin:  Skin is warm, dry and intact. No rash noted. Psychiatric: Mood and affect are normal. Speech and behavior are normal. Patient exhibits appropriate insight and judgment.  ____________________________________________    LABS (pertinent positives/negatives)  Lipase 29 CMP wnl except glu 101 CBC wbc 11.1, hgb 15.2, plt 400 UA clear, small hgb dipstick, 0-5 rbc and wbc  ____________________________________________   EKG  I, Nance Pear, attending physician, personally viewed and interpreted this EKG  EKG Time: 1854 Rate: 70 Rhythm: normal sinus rhythm Axis: normal Intervals: qtc 436 QRS: narrow ST changes: no st elevation Impression: normal ekg ____________________________________________    RADIOLOGY  CT abd/pel No acute abnormality  CT head No acute abnromality  ____________________________________________   PROCEDURES  Procedures  ____________________________________________   INITIAL IMPRESSION / ASSESSMENT AND PLAN / ED COURSE  Pertinent labs & imaging results that were available during my care of the patient were reviewed by me and considered in my medical decision making (see chart for details).   Patient presented to the emergency department today with multiple medical complaints.  These complaints have been present for at least the past 3 months.  He has seen both urology and GI for these complaints.  Patient did have blood work and a CT abdomen pelvis performed here which was without revealing etiology.  He is having some dizziness and nausea I did get a CT head to make sure that there was no intracranial mass.  This was negative.  Discussed with patient importance of following up with outpatient providers.  Will give patient prescription for nausea medicine.  ____________________________________________   FINAL CLINICAL IMPRESSION(S) / ED DIAGNOSES  Final diagnoses:  Nausea  Dizziness   Dysuria     Note: This dictation was prepared with Dragon dictation. Any transcriptional errors that result from this process are unintentional     Nance Pear, MD 07/03/19 0501

## 2019-07-03 NOTE — Discharge Instructions (Signed)
Please seek medical attention for any high fevers, chest pain, shortness of breath, change in behavior, persistent vomiting, bloody stool or any other new or concerning symptoms.  

## 2019-07-03 NOTE — ED Notes (Signed)
ED Provider at bedside. 

## 2019-07-11 NOTE — Progress Notes (Signed)
07/12/2019 10:08 AM   Douglas Baldwin [birth year] [id number]  CC: Left side pain, hematuria  HPI: Douglas Baldwin is a 38 y.o. male with a history of hematuria, chronic prostatitis and nephrolithiasis who presents today for urinary frequency and dysuria with interpreter, Douglas Baldwin.      History of hematuria (high risk) Non-smoker.  CTU in 10/2015 - no urolithiasis.  No evidence of urinary tract obstruction.  Solitary subcentimeter hypodense renal cortical lesion in the left kidney, too small to characterize, probably a benign renal cyst. No overtly suspicious renal cortical masses. No urothelial lesions.  Mild prostatomegaly.  Cystoscopy with Douglas Baldwin in 10/2015 was NED.  No reports of gross hematuria.  His UA from 05/26/2019 was negative.    Chronic prostatitis Myrbetriq ineffective.  Completed PT in 2016/12/15.  He is able to have satisfactory intercourse, but he has some slight pain with ejaculation.  He states he is a monogamous relationship with his wife and does not use condoms.  Urine culture, test for gonorrhea, chlamydia and urine plasm have been negative.  UAs have been benign.  PVRs have been minimal.  Contrast CT in November 2020 through the ED was negative.   He states today that he is feeling well.  He has seen his primary care physician and prescribed antidepressants and has been referred to a therapist.  He states that he feels his symptoms of prostatitis have been due to stressors in his life and now that he is learning better ways to deal with his stress his symptoms have abated.  He would like to have open referral to see physical therapy as needed  Nephrolithiasis He does have a history of nephrolithiasis in 2006/12/16 and Dec 15, 2012, both of which he passed on his own.  He presented to our office in 03/2019 for left flank pain.  No stone was seen on KUB.  Did not get the CT Renal stone study.  He is not having flank pain at today's visit.  He has not had any passage of fragments.  CT  renal stone study and contrast CT in 06/2019 did not identify any stones.   PMH: Past Medical History:  Diagnosis Date  . Calculus (=stone) 05/02/2013  . Chronic prostatitis     Surgical History: Past Surgical History:  Procedure Laterality Date  . NO PAST SURGERIES      Home Medications:  Allergies as of 07/12/2019   No Known Allergies     Medication List       Accurate as of July 12, 2019 10:08 AM. If you have any questions, ask your nurse or doctor.        STOP taking these medications   doxycycline 100 MG capsule Commonly known as: VIBRAMYCIN Stopped by: Douglas Council, PA-C     TAKE these medications   ondansetron 4 MG tablet Commonly known as: Zofran Take 1 tablet (4 mg total) by mouth every 8 (eight) hours as needed.   polyethylene glycol 17 g packet Commonly known as: MiraLax Take 17 g by mouth daily.   tamsulosin 0.4 MG Caps capsule Commonly known as: FLOMAX Take 1 capsule (0.4 mg total) by mouth daily.   TYLENOL PO Take by mouth as needed.       Allergies: No Known Allergies  Family History: History reviewed. No pertinent family history.  Social History:  reports that he has never smoked. He has never used smokeless tobacco. He reports that he does not drink alcohol or use drugs.  ROS: UROLOGY  Frequent Urination?: No Hard to postpone urination?: Yes Burning/pain with urination?: No Get up at night to urinate?: Yes Leakage of urine?: No Urine stream starts and stops?: Yes Trouble starting stream?: Yes Do you have to strain to urinate?: Yes Blood in urine?: No Urinary tract infection?: Yes Sexually transmitted disease?: No Injury to kidneys or bladder?: No Painful intercourse?: Yes Weak stream?: No Erection problems?: No Penile pain?: No  Gastrointestinal Nausea?: No Vomiting?: No Indigestion/heartburn?: No Diarrhea?: No Constipation?: Yes  Constitutional Fever: No Night sweats?: No Weight loss?: Yes Fatigue?: Yes   Skin Skin rash/lesions?: No Itching?: No  Eyes Blurred vision?: Yes Double vision?: No  Ears/Nose/Throat Sore throat?: No Sinus problems?: No  Hematologic/Lymphatic Swollen glands?: No Easy bruising?: No  Cardiovascular Leg swelling?: No Chest pain?: No  Respiratory Cough?: No Shortness of breath?: No  Endocrine Excessive thirst?: No  Musculoskeletal Back pain?: No Joint pain?: No  Neurological Headaches?: No Dizziness?: Yes  Psychologic Depression?: No Anxiety?: Yes  Physical Exam: BP 112/80   Pulse 69   Ht 5\' 2"  (1.575 m)   Wt 165 lb (74.8 kg)   BMI 30.18 kg/m   Constitutional:  Well nourished. Alert and oriented, No acute distress. HEENT: [place] AT, moist mucus membranes.  Trachea midline, no masses. Cardiovascular: No clubbing, cyanosis, or edema. Respiratory: Normal respiratory effort, no increased work of breathing. Neurologic: Grossly intact, no focal deficits, moving all 4 extremities. Psychiatric: Normal mood and affect.  Laboratory Data: Results for orders placed or performed during the hospital encounter of 07/03/19  Lipase, blood  Result Value Ref Range   Lipase 29 11 - 51 U/L  Comprehensive metabolic panel  Result Value Ref Range   Sodium 139 135 - 145 mmol/L   Potassium 3.7 3.5 - 5.1 mmol/L   Chloride 104 98 - 111 mmol/L   CO2 24 22 - 32 mmol/L   Glucose, Bld 101 (H) 70 - 99 mg/dL   BUN 11 6 - 20 mg/dL   Creatinine, Ser 07/05/19 0.61 - 1.24 mg/dL   Calcium 9.9 8.9 - 0.86 mg/dL   Total Protein 8.0 6.5 - 8.1 g/dL   Albumin 4.9 3.5 - 5.0 g/dL   AST 21 15 - 41 U/L   ALT 27 0 - 44 U/L   Alkaline Phosphatase 62 38 - 126 U/L   Total Bilirubin 0.9 0.3 - 1.2 mg/dL   GFR calc non Af Amer >60 >60 mL/min   GFR calc Af Amer >60 >60 mL/min   Anion gap 11 5 - 15  CBC  Result Value Ref Range   WBC 11.1 (H) 4.0 - 10.5 K/uL   RBC 4.95 4.22 - 5.81 MIL/uL   Hemoglobin 15.2 13.0 - 17.0 g/dL   HCT 76.1 95.0 - 93.2 %   MCV 88.9 80.0 - 100.0 fL   MCH  30.7 26.0 - 34.0 pg   MCHC 34.5 30.0 - 36.0 g/dL   RDW 67.1 24.5 - 80.9 %   Platelets 400 150 - 400 K/uL   nRBC 0.0 0.0 - 0.2 %  Urinalysis, Complete w Microscopic  Result Value Ref Range   Color, Urine STRAW (A) YELLOW   APPearance CLEAR (A) CLEAR   Specific Gravity, Urine 1.005 1.005 - 1.030   pH 7.0 5.0 - 8.0   Glucose, UA NEGATIVE NEGATIVE mg/dL   Hgb urine dipstick SMALL (A) NEGATIVE   Bilirubin Urine NEGATIVE NEGATIVE   Ketones, ur NEGATIVE NEGATIVE mg/dL   Protein, ur NEGATIVE NEGATIVE mg/dL   Nitrite  NEGATIVE NEGATIVE   Leukocytes,Ua NEGATIVE NEGATIVE   RBC / HPF 0-5 0 - 5 RBC/hpf   WBC, UA 0-5 0 - 5 WBC/hpf   Bacteria, UA NONE SEEN NONE SEEN   Squamous Epithelial / LPF NONE SEEN 0 - 5   Pertinent Imaging: CLINICAL DATA:  Left flank pain for 1-2 months.  EXAM: CT ABDOMEN AND PELVIS WITHOUT CONTRAST  TECHNIQUE: Multidetector CT imaging of the abdomen and pelvis was performed following the standard protocol without IV contrast.  COMPARISON:  None.  FINDINGS: Lower chest: Lung bases clear.  No pleural or pericardial effusion.  Hepatobiliary: No focal liver abnormality is seen. No gallstones, gallbladder wall thickening, or biliary dilatation.  Pancreas: Unremarkable. No pancreatic ductal dilatation or surrounding inflammatory changes.  Spleen: Normal in size without focal abnormality.  Adrenals/Urinary Tract: Adrenal glands are unremarkable. Kidneys are normal, without renal calculi, focal lesion, or hydronephrosis. Bladder is unremarkable.  Stomach/Bowel: Stomach is within normal limits. Appendix appears normal. No evidence of bowel wall thickening, distention, or inflammatory changes.  Vascular/Lymphatic: No significant vascular findings are present. No enlarged abdominal or pelvic lymph nodes.  Reproductive: Prostate is unremarkable.  Other: Small fat containing umbilical hernia is noted  Musculoskeletal: Negative.  IMPRESSION:  Negative for urinary tract stone. No acute abnormality or finding to explain the patient's symptoms.  Small fat containing umbilical hernia.   Electronically Signed   By: Drusilla Kanner M.D.   On: 06/22/2019 11:12   CLINICAL DATA:  Nausea, vomiting and abdominal pain  EXAM: CT ABDOMEN AND PELVIS WITH CONTRAST  TECHNIQUE: Multidetector CT imaging of the abdomen and pelvis was performed using the standard protocol following bolus administration of intravenous contrast.  CONTRAST:  OMNIPAQUE IOHEXOL 300 MG/ML  SOLN  COMPARISON:  CT renal colic 06/22/2019  FINDINGS: Lower chest: Lung bases are clear. Normal heart size. No pericardial effusion.  Hepatobiliary: No focal liver abnormality is seen. No gallstones, gallbladder wall thickening, or biliary dilatation.  Pancreas: Unremarkable. No pancreatic ductal dilatation or surrounding inflammatory changes.  Spleen: Normal in size without focal abnormality.  Adrenals/Urinary Tract: Normal adrenal glands. Few subcentimeter hypoattenuating foci in both kidneys, too small to fully characterize on CT imaging but statistically likely benign.  Stomach/Bowel: Distal esophagus, stomach and duodenal sweep are unremarkable. No small bowel wall thickening or dilatation. No evidence of obstruction. A normal appendix is visualized. No colonic dilatation or wall thickening. Scattered colonic diverticula without focal pericolonic inflammation to suggest diverticulitis.  Vascular/Lymphatic: The aorta is normal caliber. No suspicious or enlarged lymph nodes in the included lymphatic chains.  Reproductive: Mild prostatomegaly. Few coarse eccentric prostate calcifications. Seminal vesicles are unremarkable.  Other: No abdominopelvic free fluid or free gas. No bowel containing hernias. Small fat containing umbilical hernia.  Musculoskeletal: No acute osseous abnormality or suspicious osseous lesion. Mild  degenerative changes L5-S1 with slight retrolisthesis. Minimal degenerative change of both hips.  IMPRESSION: No acute intra-abdominal process. No explanation for the patient's symptoms.   Electronically Signed   By: Kreg Shropshire M.D.   On: 07/03/2019 03:33 I have independently reviewed the films  Assessment & Plan:    1. Chronic prostatitis Patient attributes this to stressors and has been seeing a therapist and would like to have an open referral for PT I agree with his assessment and stated that when he is needing a referral back to physical therapy to contact our office and I will send a referral over to the physical therapy department RTC prn  2. Constipation/Black stools  Seen by GI - recommendations as in HPI   3. History of hematuria Hematuria work up completed in 10/2015 - negative No report of gross hematuria   4. History of nephrolithiasis CT's negative for stone   Michiel CowboySHANNON Chihiro Frey, [facility]   [facility] [address], [address] [place], [place] [phone]

## 2019-07-12 ENCOUNTER — Other Ambulatory Visit: Payer: Self-pay

## 2019-07-12 ENCOUNTER — Encounter: Payer: Self-pay | Admitting: Urology

## 2019-07-12 ENCOUNTER — Ambulatory Visit (INDEPENDENT_AMBULATORY_CARE_PROVIDER_SITE_OTHER): Payer: 59 | Admitting: Urology

## 2019-07-12 VITALS — BP 112/80 | HR 69 | Ht 62.0 in | Wt 165.0 lb

## 2019-07-12 DIAGNOSIS — N411 Chronic prostatitis: Secondary | ICD-10-CM

## 2019-07-12 NOTE — Patient Instructions (Signed)
Mediterranean Diet  Why follow it? Research shows. . Those who follow the Mediterranean diet have a reduced risk of heart disease  . The diet is associated with a reduced incidence of Parkinson's and Alzheimer's diseases . People following the diet may have longer life expectancies and lower rates of chronic diseases  . The Dietary Guidelines for Americans recommends the Mediterranean diet as an eating plan to promote health and prevent disease  What Is the Mediterranean Diet?  . Healthy eating plan based on typical foods and recipes of Mediterranean-style cooking . The diet is primarily a plant based diet; these foods should make up a majority of meals   Starches - Plant based foods should make up a majority of meals - They are an important sources of vitamins, minerals, energy, antioxidants, and fiber - Choose whole grains, foods high in fiber and minimally processed items  - Typical grain sources include wheat, oats, barley, corn, brown rice, bulgar, farro, millet, polenta, couscous  - Various types of beans include chickpeas, lentils, fava beans, black beans, white beans   Fruits  Veggies - Large quantities of antioxidant rich fruits & veggies; 6 or more servings  - Vegetables can be eaten raw or lightly drizzled with oil and cooked  - Vegetables common to the traditional Mediterranean Diet include: artichokes, arugula, beets, broccoli, brussel sprouts, cabbage, carrots, celery, collard greens, cucumbers, eggplant, kale, leeks, lemons, lettuce, mushrooms, okra, onions, peas, peppers, potatoes, pumpkin, radishes, rutabaga, shallots, spinach, sweet potatoes, turnips, zucchini - Fruits common to the Mediterranean Diet include: apples, apricots, avocados, cherries, clementines, dates, figs, grapefruits, grapes, melons, nectarines, oranges, peaches, pears, pomegranates, strawberries, tangerines  Fats - Replace butter and margarine with healthy oils, such as olive oil, canola oil, and tahini   - Limit nuts to no more than a handful a day  - Nuts include walnuts, almonds, pecans, pistachios, pine nuts  - Limit or avoid candied, honey roasted or heavily salted nuts - Olives are central to the [employer] - can be eaten whole or used in a variety of dishes   Meats Protein - Limiting red meat: no more than a few times a month - When eating red meat: choose lean cuts and keep the portion to the size of deck of cards - Eggs: approx. 0 to 4 times a week  - Fish and lean poultry: at least 2 a week  - Healthy protein sources include, chicken, [place], lean beef, lamb - Increase intake of seafood such as tuna, salmon, trout, mackerel, shrimp, scallops - Avoid or limit high fat processed meats such as sausage and bacon  Dairy - Include moderate amounts of low fat dairy products  - Focus on healthy dairy such as fat free yogurt, skim milk, low or reduced fat cheese - Limit dairy products higher in fat such as whole or 2% milk, cheese, ice cream  Alcohol - Moderate amounts of red wine is ok  - No more than 5 oz daily for women (all ages) and men older than age 81  - No more than 10 oz of wine daily for men younger than 34  Other - Limit sweets and other desserts  - Use herbs and spices instead of salt to flavor foods  - Herbs and spices common to the traditional Mediterranean Diet include: basil, bay leaves, chives, cloves, cumin, fennel, garlic, lavender, marjoram, mint, oregano, parsley, pepper, rosemary, sage, savory, sumac, tarragon, thyme   It's not just a diet, it's a lifestyle:  . The  Mediterranean diet includes lifestyle factors typical of those in the region  . Foods, drinks and meals are best eaten with others and savored . Daily physical activity is important for overall good health . This could be strenuous exercise like running and aerobics . This could also be more leisurely activities such as walking, housework, yard-work, or taking the stairs . Moderation is the key;  a balanced and healthy diet accommodates most foods and drinks . Consider portion sizes and frequency of consumption of certain foods   Meal Ideas & Options:  . Breakfast:  o Whole wheat toast or whole wheat English muffins with peanut butter & hard boiled egg o Steel cut oats topped with apples & cinnamon and skim milk  o Fresh fruit: banana, strawberries, melon, berries, peaches  o Smoothies: strawberries, bananas, greek yogurt, peanut butter o Low fat greek yogurt with blueberries and granola  o Egg white omelet with spinach and mushrooms o Breakfast couscous: whole wheat couscous, apricots, skim milk, cranberries  . Sandwiches:  o Hummus and grilled vegetables (peppers, zucchini, squash) on whole wheat bread   o Grilled chicken on whole wheat pita with lettuce, tomatoes, cucumbers or tzatziki  o Tuna salad on whole wheat bread: tuna salad made with greek yogurt, olives, red peppers, capers, green onions o Garlic rosemary lamb pita: lamb sauted with garlic, rosemary, salt & pepper; add lettuce, cucumber, greek yogurt to pita - flavor with lemon juice and black pepper  . Seafood:  o Mediterranean grilled salmon, seasoned with garlic, basil, parsley, lemon juice and black pepper o Shrimp, lemon, and spinach whole-grain pasta salad made with low fat greek yogurt  o Seared scallops with lemon orzo  o Seared tuna steaks seasoned salt, pepper, coriander topped with tomato mixture of olives, tomatoes, olive oil, minced garlic, parsley, green onions and cappers  . Meats:  o Herbed greek chicken salad with kalamata olives, cucumber, feta  o Red bell peppers stuffed with spinach, bulgur, lean ground beef (or lentils) & topped with feta   o Kebabs: skewers of chicken, tomatoes, onions, zucchini, squash  o [place] burgers: made with red onions, mint, dill, lemon juice, feta cheese topped with roasted red peppers . Vegetarian o Cucumber salad: cucumbers, artichoke hearts, celery, red onion, feta  cheese, tossed in olive oil & lemon juice  o Hummus and whole grain pita points with a greek salad (lettuce, tomato, feta, olives, cucumbers, red onion) o Lentil soup with celery, carrots made with vegetable broth, garlic, salt and pepper  o Tabouli salad: parsley, bulgur, mint, scallions, cucumbers, tomato, radishes, lemon juice, olive oil, salt and pepper.   PT phone number [phone]

## 2019-07-20 ENCOUNTER — Ambulatory Visit: Payer: 59 | Admitting: Gastroenterology

## 2019-08-15 ENCOUNTER — Ambulatory Visit: Payer: 59 | Attending: Physician Assistant

## 2019-08-15 ENCOUNTER — Other Ambulatory Visit: Payer: Self-pay

## 2019-08-15 DIAGNOSIS — R42 Dizziness and giddiness: Secondary | ICD-10-CM | POA: Insufficient documentation

## 2019-08-15 NOTE — Therapy (Signed)
[facility] MAIN [facility] SERVICES [address] [place], [place], [address] Phone: [phone]   Fax:  [phone]  Physical Therapy Evaluation  Patient Details  Name: Douglas Baldwin MRN: [id number] Date of Birth: 08-Dec-1980 Referring Provider (PT): Meta Hatchet PA-C   Encounter Date: 08/15/2019  PT End of Session - 08/15/19 1549    Visit Number  1    Number of Visits  12    Date for PT Re-Evaluation  11/07/19    Authorization Type  eval 08/15/19    PT Start Time  1520    PT Stop Time  1615    PT Time Calculation (min)  55 min    Equipment Utilized During Treatment  Gait belt    Activity Tolerance  Patient tolerated treatment well    Behavior During Therapy  Anxious       Past Medical History:  Diagnosis Date  . Calculus (=stone) 05/02/2013  . Chronic prostatitis     Past Surgical History:  Procedure Laterality Date  . NO PAST SURGERIES      There were no vitals filed for this visit.   Subjective Assessment - 08/15/19 1518    Subjective  Dizziness    Pertinent History  Douglas Baldwin is a 39 y.o. male who presented to the emergency department on 07/03/19 with primary complaint of nausea and dizziness. At that time the symptoms had been going on for a few months. It had been accompanied by some abdominal discomfort and decreased appetite. Additionally the patient complained of urinary discomfort. He said that he had the discomfort on and off for the past couple of years, but for the past three months it had been worse. He has seen both urology and GI during this time. Has been placed on antibiotics for urinary infection. His PCP started him on antidepressants which pt reports are helping a little with his anxiety. He continues to endorse significant anxiety at the time of PT evaluation. The patient was referred to ENT who ordered a VNG study. Study was normal with the exception of decreased calorics bilaterally. He was referred for  vestibular physical therapy. Pt reports that his symptoms feel slightly worse now than when they first started. He reports a couple months ago he was having frequent headaches but denies current headaches. He has an upcoming appointment to see neurology.    Limitations  Walking    Diagnostic tests  VNG study showed bilateral caloric weakness    Currently in Pain?  No/denies         [facility] PT Assessment - 08/16/19 2129      Assessment   Medical Diagnosis  Dizziness    Referring Provider (PT)  Meta Hatchet PA-C    Onset Date/Surgical Date  04/15/19   Approximate   Hand Dominance  Right    Next MD Visit  Not reported    Prior Therapy  Not for dizziness      Precautions   Precautions  None      Restrictions   Weight Bearing Restrictions  No      Balance Screen   Has the patient fallen in the past 6 months  No      Home Environment   Living Environment  Private residence      Prior Function   Level of Independence  Independent    Vocation  Full time employment    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  Overall Cognitive Status  Within Functional Limits for tasks assessed      Standardized Balance Assessment   Standardized Balance Assessment  Berg Balance Test      Berg Balance Test   Sit to Stand  Able to stand without using hands and stabilize independently    Standing Unsupported  Able to stand safely 2 minutes    Sitting with Back Unsupported but Feet Supported on Floor or Stool  Able to sit safely and securely 2 minutes    Stand to Sit  Sits safely with minimal use of hands    Transfers  Able to transfer safely, minor use of hands    Standing Unsupported with Eyes Closed  Able to stand 10 seconds safely    Standing Unsupported with Feet Together  Able to place feet together independently and stand 1 minute safely    From Standing, Reach Forward with Outstretched Arm  Can reach confidently >25 cm (10")    From Standing Position, Pick up Object from  Floor  Able to pick up shoe safely and easily    From Standing Position, Turn to Look Behind Over each Shoulder  Looks behind from both sides and weight shifts well    Turn 360 Degrees  Able to turn 360 degrees safely in 4 seconds or less    Standing Unsupported, Alternately Place Feet on Step/Stool  Able to stand independently and safely and complete 8 steps in 20 seconds    Standing Unsupported, One Foot in Front  Able to place foot tandem independently and hold 30 seconds    Standing on One Leg  Able to lift leg independently and hold > 10 seconds    Total Score  56      Functional Gait  Assessment   Gait assessed   Yes    Gait Level Surface  Walks 20 ft in less than 5.5 sec, no assistive devices, good speed, no evidence for imbalance, normal gait pattern, deviates no more than 6 in outside of the 12 in walkway width.    Change in Gait Speed  Able to smoothly change walking speed without loss of balance or gait deviation. Deviate no more than 6 in outside of the 12 in walkway width.    Gait with Horizontal Head Turns  Performs head turns smoothly with no change in gait. Deviates no more than 6 in outside 12 in walkway width    Gait with Vertical Head Turns  Performs head turns with no change in gait. Deviates no more than 6 in outside 12 in walkway width.    Gait and Pivot Turn  Pivot turns safely within 3 sec and stops quickly with no loss of balance.    Step Over Obstacle  Is able to step over 2 stacked shoe boxes taped together (9 in total height) without changing gait speed. No evidence of imbalance.    Gait with Narrow Base of Support  Is able to ambulate for 10 steps heel to toe with no staggering.    Gait with Eyes Closed  Walks 20 ft, slow speed, abnormal gait pattern, evidence for imbalance, deviates 10-15 in outside 12 in walkway width. Requires more than 9 sec to ambulate 20 ft.    Ambulating Backwards  Walks 20 ft, no assistive devices, good speed, no evidence for imbalance, normal  gait    Steps  Alternating feet, no rail.    Total Score  28         VESTIBULAR AND  BALANCE EVALUATION   HISTORY:  Subjective history of current problem: Douglas Baldwin is a 39 y.o. male who presented to the emergency department on 07/03/19 with primary complaint of nausea and dizziness. At that time the symptoms had been going on for a few months. It had been accompanied by some abdominal discomfort and decreased appetite. Additionally the patient complained of urinary discomfort. He said that he had the discomfort on and off for the past couple of years, but for the past three months it had been worse. He has seen both urology and GI during this time. Has been placed on antibiotics for urinary infection. His PCP started him on antidepressants which pt reports are helping a little with his anxiety. He continues to endorse significant anxiety at the time of PT evaluation. The patient was referred to ENT who ordered a VNG study. Study was normal with the exception of decreased calorics bilaterally. He was referred for vestibular physical therapy. Pt reports that his symptoms feel slightly worse now than when they first started. He reports a couple months ago he was having frequent headaches but denies current headaches. He has an upcoming appointment to see neurology.  Description of dizziness: unsteadiness, imbalance Frequency: multiple times/day Duration: seconds Symptom nature: (motion provoked, positional, spontaneous, constant, variable, intermittent) always motion-provoked  Provocative Factors: walking, turning quickly, anxiety  Easing Factors: sitting down, rest  Progression of symptoms: (better, worse, no change since onset) worsening History of similar episodes: 3 years ago had some dizziness with an ear infection but resolved with treatment. 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   Red Flags: frequent issues with bladder, recent weight loss approximately 15# over the last 3 months; Denies dysarthria, dysphagia,    EXAMINATION  POSTURE: WNL  NEUROLOGICAL SCREEN: (2+ unless otherwise noted.) N=normal  Ab=abnormal  Level Dermatome R L Myotome R L Reflex R L  C3 Anterior Neck N N Sidebend C2-3 N N Jaw CN V    C4 Top of Shoulder N N Shoulder Shrug C4 N N Hoffman's UMN    C5 Lateral Upper Arm N N Shoulder ABD C4-5 N N Biceps C5-6    C6 Lateral Arm/ Thumb N N Arm Flex/ Wrist Ext C5-6 N N Brachiorad. C5-6    C7 Middle Finger N N Arm Ext//Wrist Flex C6-7 N N Triceps C7    C8 4th & 5th Finger N N Flex/ Ext Carpi Ulnaris C8 N N Patellar (L3-4)    T1 Medial Arm N N Interossei T1 N N Gastrocnemius    L2 Medial thigh/groin N N Illiopsoas (L2-3) N N     L3 Lower thigh/med.knee N N Quadriceps (L3-4) N N     L4 Medial leg/lat thigh N N Tibialis Ant (L4-5) N N     L5 Lat. leg & dorsal foot N N EHL (L5) N N     S1 post/lat foot/thigh/leg N N Gastrocnemius (S1-2) N N     S2 Post./med. thigh & leg N N Hamstrings (L4-S3) N N       Cranial Nerves Visual acuity and visual fields are intact  Extraocular muscles are intact  Facial sensation is intact bilaterally  Facial strength is intact bilaterally  Hearing is normal as tested by gross conversation Palate elevates midline, normal phonation  Shoulder shrug strength is intact  Tongue protrudes midline    SOMATOSENSORY:         Sensation           Intact      Diminished         Absent  Light touch Normal      COORDINATION: Finger to Nose: Normal Heel to Shin: Normal Pronator Drift:  Negative Rapid Alternating Movements: Normal Finger to Thumb Opposition: Normal  MUSCULOSKELETAL SCREEN: Cervical Spine ROM: WFL and painless in all planes. No gross deficits identified   ROM: WNL  MMT: WNL  Functional Mobility: Independent for ambulation without assistive device    POSTURAL CONTROL TESTS:   Clinical Test of Sensory Interaction for Balance    (CTSIB):  CONDITION TIME STRATEGY SWAY  Eyes open, firm surface 30 seconds ankle 1+  Eyes closed, firm surface 30 seconds ankle 3+  Eyes open, foam surface 30 seconds ankle 2+  Eyes closed, foam surface 30 seconds ankle 3+, pt gets very close to falling    OCULOMOTOR / VESTIBULAR TESTING:  Oculomotor Exam- Room Light  Findings Comments  Ocular Alignment normal   Ocular ROM normal   Spontaneous Nystagmus normal   Gaze-Holding Nystagmus normal   End-Gaze Nystagmus normal   Vergence (normal 2-3") normal   Smooth Pursuit normal 3"  Cross-Cover Test not examined   Saccades normal   VOR Cancellation normal   Left Head Thrust normal   Right Head Thrust normal   Static Acuity normal 20/13 (line 10)  Dynamic Acuity normal 20/20 (line 8), two line loss during DVA    BPPV TESTS: Deferred at this time    FUNCTIONAL OUTCOME MEASURES   Results Comments  BERG 56/56 WNL  DVA 20/13 static to 20/20 dynamic WNL, 2 line loss  FGA 28/30 Lost 2 points for eyes closed ambulation              Objective measurements completed on examination: See above findings.              PT Education - 08/16/19 2133    Education provided  Yes    Education Details  Plan of care and HEP    Person(s) Educated  Patient    Methods  Explanation;Demonstration;Handout    Comprehension  Verbalized understanding;Returned demonstration;Other (comment)   Interpreter utilized      PT Short Term Goals - 08/16/19 2152      PT SHORT TERM GOAL #1   Title  Pt will be independent with HEP in order to decrease dizziness and  improve function at home and work.    Time  6    Period  Weeks    Status  New    Target Date  09/26/19        PT Long Term Goals - 08/16/19 2154      PT LONG TERM GOAL #1   Title  Pt will improve FGA to 30/30 in order to demonstrate clinically significant improvement in dizziness and ability resume full symptom-free functon at home.    Baseline  08/15/19: 28/30    Time  12    Period  Weeks    Status  New    Target Date  11/07/19      PT LONG TERM GOAL #2   Title  Pt  will report 100% resolution of his dizziness symptoms in order to resume full symptom-free function at home and work    Time  12    Period  Weeks    Status  New    Target Date  11/08/19             Plan - 08/15/19 1549    Clinical Impression Statement  Pt is a pleasant 39 year-old male referred for dizziness. Pt is visibly anxious during initial evaluation avoiding eye contact and appearing unsettled. He endorses that his episodes of increased anxiety tend to coincide with his bouts of dizziness and it seems reasonable to consider that his anxiety is at least contributing in part to his dizziness. He also complains of headaches which could be contributing to his dizziness and he has an upcoming appointment with neurology. Vestibular examination is grossly unrevealing. No abnormalities noted in bedside oculomotor/vestibular testing. DVA is WNL with only a 2 line loss. Modified CTSIB is normal however pt does demonstrate considerably more difficulty with balance during eyes closed activities. BERG is 56/56 and FGA is 28/30. Pt does endorse some mild dizziness during gaze stabilization exercise so this was issued for an HEP. Pt presents with dizziness and he will benefit from a trial of skilled vestibular physical therapy to assess benefit.    Personal Factors and Comorbidities  Comorbidity 2    Comorbidities  Anxiety, chronic prostatitis    Examination-Activity Limitations  Locomotion Level    Examination-Participation  Restrictions  Community Activity;Other   Work   Public affairs consultant  Low    Rehab Potential  Good    Clinical Impairments Affecting Rehab Potential  --    PT Frequency  1x / week    PT Duration  12 weeks    PT Treatment/Interventions  ADLs/Self Care Home Management;Aquatic Therapy;Moist Heat;Therapeutic exercise;Therapeutic activities;Functional mobility training;Stair training;Gait training;Neuromuscular re-education;Patient/family education;Balance training;Manual techniques;Biofeedback;Canalith Repostioning;Cryotherapy;Electrical Stimulation;Iontophoresis 4mg /ml Dexamethasone;Traction;Ultrasound;DME Instruction;Dry needling;Vestibular;Joint Manipulations    PT Next Visit Plan  Have pt complete FOTO? (DHI, ABC in spanish), review VOR x 1 horizontal and progress vestibular exercises    PT Home Exercise Plan  VOR x 1 standing with feet apart 60s x 3, 4x/day    Consulted and Agree with Plan of Care  Patient       Patient will benefit from skilled therapeutic intervention in order to improve the following deficits and impairments:  Dizziness  Visit Diagnosis: Dizziness and giddiness     Problem List Patient Active Problem List   Diagnosis Date Noted  . Calculus (=stone) 05/02/2013  . Urolithiasis 05/02/2013   Douglas Baldwin PT, DPT, GCS  Douglas Baldwin 08/16/2019, 9:56 PM  [place] MAIN [facility] SERVICES [address] [place], [place], [address] Phone: [phone]   Fax:  [phone]  Name: Douglas Baldwin MRN: [id number] Date of Birth: [birth year]

## 2019-08-22 ENCOUNTER — Ambulatory Visit: Payer: 59

## 2019-08-25 ENCOUNTER — Other Ambulatory Visit (HOSPITAL_COMMUNITY): Payer: Self-pay | Admitting: Neurology

## 2019-08-25 ENCOUNTER — Other Ambulatory Visit: Payer: Self-pay | Admitting: Neurology

## 2019-08-25 DIAGNOSIS — R42 Dizziness and giddiness: Secondary | ICD-10-CM

## 2019-08-29 ENCOUNTER — Ambulatory Visit: Payer: 59

## 2019-09-03 ENCOUNTER — Ambulatory Visit
Admission: RE | Admit: 2019-09-03 | Discharge: 2019-09-03 | Disposition: A | Discharge status: Home / Self Care | Payer: 59 | Source: Ambulatory Visit | Attending: Neurology | Admitting: Neurology

## 2019-09-03 ENCOUNTER — Other Ambulatory Visit: Payer: Self-pay

## 2019-09-03 DIAGNOSIS — R42 Dizziness and giddiness: Secondary | ICD-10-CM | POA: Insufficient documentation

## 2019-09-03 MED ORDER — GADOBUTROL 1 MMOL/ML IV SOLN
7.5000 mL | Freq: Once | INTRAVENOUS | Status: AC | PRN
  Administered 2019-09-03: 7.5 mL via INTRAVENOUS

## 2019-09-05 ENCOUNTER — Ambulatory Visit: Payer: 59

## 2019-09-12 ENCOUNTER — Ambulatory Visit: Payer: 59 | Attending: Physician Assistant

## 2019-09-19 ENCOUNTER — Ambulatory Visit: Payer: 59

## 2019-10-13 ENCOUNTER — Emergency Department (EMERGENCY_DEPARTMENT_HOSPITAL)
Admission: EM | Admit: 2019-10-13 | Discharge: 2019-10-14 | Disposition: A | Discharge status: Other Acute Inpatient Hospital | Payer: 59 | Source: Home / Self Care | Attending: Emergency Medicine | Admitting: Emergency Medicine

## 2019-10-13 ENCOUNTER — Other Ambulatory Visit: Payer: Self-pay

## 2019-10-13 DIAGNOSIS — F329 Major depressive disorder, single episode, unspecified: Secondary | ICD-10-CM | POA: Insufficient documentation

## 2019-10-13 DIAGNOSIS — Z20822 Contact with and (suspected) exposure to covid-19: Secondary | ICD-10-CM | POA: Insufficient documentation

## 2019-10-13 DIAGNOSIS — F411 Generalized anxiety disorder: Secondary | ICD-10-CM | POA: Diagnosis not present

## 2019-10-13 DIAGNOSIS — R45851 Suicidal ideations: Secondary | ICD-10-CM | POA: Insufficient documentation

## 2019-10-13 DIAGNOSIS — F32A Depression, unspecified: Secondary | ICD-10-CM | POA: Insufficient documentation

## 2019-10-13 DIAGNOSIS — F419 Anxiety disorder, unspecified: Secondary | ICD-10-CM | POA: Insufficient documentation

## 2019-10-13 LAB — COMPREHENSIVE METABOLIC PANEL
ALT: 20 U/L (ref 0–44)
AST: 17 U/L (ref 15–41)
Albumin: 5.3 g/dL — ABNORMAL HIGH (ref 3.5–5.0)
Alkaline Phosphatase: 68 U/L (ref 38–126)
Anion gap: 10 (ref 5–15)
BUN: 11 mg/dL (ref 6–20)
CO2: 24 mmol/L (ref 22–32)
Calcium: 10.1 mg/dL (ref 8.9–10.3)
Chloride: 104 mmol/L (ref 98–111)
Creatinine, Ser: 0.92 mg/dL (ref 0.61–1.24)
GFR calc Af Amer: >60 mL/min (ref >60)
GFR calc non Af Amer: >60 mL/min (ref >60)
Glucose, Bld: 110 mg/dL — ABNORMAL HIGH (ref 70–99)
Potassium: 4.4 mmol/L (ref 3.5–5.1)
Sodium: 138 mmol/L (ref 135–145)
Total Bilirubin: 0.9 mg/dL (ref 0.3–1.2)
Total Protein: 8.7 g/dL — ABNORMAL HIGH (ref 6.5–8.1)

## 2019-10-13 LAB — CBC
HCT: 49.7 % (ref 39.0–52.0)
Hemoglobin: 17.3 g/dL — ABNORMAL HIGH (ref 13.0–17.0)
MCH: 31.5 pg (ref 26.0–34.0)
MCHC: 34.8 g/dL (ref 30.0–36.0)
MCV: 90.4 fL (ref 80.0–100.0)
Platelets: 439 10*3/uL — ABNORMAL HIGH (ref 150–400)
RBC: 5.5 MIL/uL (ref 4.22–5.81)
RDW: 11.5 % (ref 11.5–15.5)
WBC: 10.1 10*3/uL (ref 4.0–10.5)
nRBC: 0 % (ref 0.0–0.2)

## 2019-10-13 LAB — ETHANOL: Alcohol, Ethyl (B): <10 mg/dL (ref <10)

## 2019-10-13 LAB — RESPIRATORY PANEL BY RT PCR (FLU A&B, COVID)
Influenza A by PCR: NEGATIVE
Influenza B by PCR: NEGATIVE
SARS Coronavirus 2 by RT PCR: NEGATIVE

## 2019-10-13 LAB — ACETAMINOPHEN LEVEL: Acetaminophen (Tylenol), Serum: <10 ug/mL — ABNORMAL LOW (ref 10–30)

## 2019-10-13 LAB — SALICYLATE LEVEL: Salicylate Lvl: <7 mg/dL — ABNORMAL LOW (ref 7.0–30.0)

## 2019-10-13 MED ORDER — LORAZEPAM 2 MG PO TABS
2.0000 mg | ORAL_TABLET | Freq: Once | ORAL | Status: AC
  Administered 2019-10-13: 2 mg via ORAL
  Filled 2019-10-13: qty 1

## 2019-10-13 NOTE — ED Triage Notes (Signed)
Pt to ER states via interpreter pt reports depression for over a year. States today is feeling worse than usual.  Pt is tearful in ER.

## 2019-10-13 NOTE — ED Notes (Signed)
Pt. Eating food tray.  Pt.  Calm and cooperative sitting on bed.  Pt. Has no questions or concerns at this time.  Pt. Given phone to call wife and son who are in ED waiting room.

## 2019-10-13 NOTE — ED Provider Notes (Signed)
[facility] Emergency Department Provider Note  ____________________________________________   I have reviewed the triage vital signs and the nursing notes.   HISTORY  Chief Complaint Suicidal   History limited by: Language [facility] Interpreter utilized   HPI Douglas Baldwin is a 39 y.o. male who presents to the emergency department today because of concern for anxiety and depression. The patient states htat he has been dealing with anxiety and depression since last summer. He has been tried on multiple different medications without any significant relief. Was seen at [facility] roughly 10 days ago for similar complaints. States he has followed up with his psychiatrist since then.   Records reviewed. Per medical record review patient has a history of recent visit to [facility] for depression/anxiety.   Past Medical History:  Diagnosis Date  . Calculus (=stone) 05/02/2013  . Chronic prostatitis     Patient Active Problem List   Diagnosis Date Noted  . Calculus (=stone) 05/02/2013  . Urolithiasis 05/02/2013    Past Surgical History:  Procedure Laterality Date  . NO PAST SURGERIES      Prior to Admission medications   Medication Sig Start Date End Date Taking? Authorizing Provider  Acetaminophen (TYLENOL PO) Take by mouth as needed.    [provider]  ondansetron (ZOFRAN) 4 MG tablet Take 1 tablet (4 mg total) by mouth every 8 (eight) hours as needed. Patient not taking: Reported on 07/12/2019 07/03/19   Phineas Semen, MD  polyethylene glycol (MIRALAX) 17 g packet Take 17 g by mouth daily. 05/30/19   Pasty Spillers, MD  tamsulosin (FLOMAX) 0.4 MG CAPS capsule Take 1 capsule (0.4 mg total) by mouth daily. Patient not taking: Reported on 07/12/2019 05/26/19   Michiel Cowboy A, PA-C    Allergies Patient has no known allergies.  No family history on file.  Social History Social History   Tobacco Use  . Smoking status: Never  Smoker  . Smokeless tobacco: Never Used  Substance Use Topics  . Alcohol use: No    Alcohol/week: 0.0 standard drinks  . Drug use: No    Review of Systems Constitutional: No fever/chills Eyes: No visual changes. ENT: No sore throat. Cardiovascular: Denies chest pain. Respiratory: Denies shortness of breath. Gastrointestinal: No abdominal pain.  No nausea, no vomiting.  No diarrhea.   Genitourinary: Negative for dysuria. Musculoskeletal: Negative for back pain. Skin: Negative for rash. Neurological: Negative for headaches, focal weakness or numbness.  ____________________________________________   PHYSICAL EXAM:  VITAL SIGNS: ED Triage Vitals  Enc Vitals Group     BP 10/13/19 1556 (!) 135/94     Pulse Rate 10/13/19 1556 97     Resp 10/13/19 1556 18     Temp 10/13/19 1556 98.6 F (37 C)     Temp Source 10/13/19 1556 Oral     SpO2 10/13/19 1556 99 %     Weight 10/13/19 1600 160 lb (72.6 kg)     Height 10/13/19 1600 5\' 7"  (1.702 m)     Head Circumference --      Peak Flow --      Pain Score 10/13/19 1600 0   Constitutional: Alert and oriented.  Eyes: Conjunctivae are normal.  ENT      Head: Normocephalic and atraumatic.      Nose: No congestion/rhinnorhea.      Mouth/Throat: Mucous membranes are moist.      Neck: No stridor. Hematological/Lymphatic/Immunilogical: No cervical lymphadenopathy. Cardiovascular: Normal rate, regular rhythm.  No murmurs,  rubs, or gallops.  Respiratory: Normal respiratory effort without tachypnea nor retractions. Breath sounds are clear and equal bilaterally. No wheezes/rales/rhonchi. Gastrointestinal: Soft and non tender. No rebound. No guarding.  Genitourinary: Deferred Musculoskeletal: Normal range of motion in all extremities. No lower extremity edema. Neurologic:  Normal speech and language. No gross focal neurologic deficits are appreciated.  Skin:  Skin is warm, dry and intact. No rash noted. Psychiatric: Tearful, depressed.   ____________________________________________    LABS (pertinent positives/negatives)  Ethanol, salicylate, acetaminophen below threshold CBC wbc 10.1, hgb 17.3, plt 439 CMP wnl except glu 110, t pro 8.7, alb 5.3  ____________________________________________   EKG  None  ____________________________________________    RADIOLOGY  None  ____________________________________________   PROCEDURES  Procedures  ____________________________________________   INITIAL IMPRESSION / ASSESSMENT AND PLAN / ED COURSE  Pertinent labs & imaging results that were available during my care of the patient were reviewed by me and considered in my medical decision making (see chart for details).   Patient presented to the emergency department today because of concern for anxiety and depression. The patient was quite tearful on exam. Psychiatry did evaluate and will plan on admission. Blood work without concerning findings.    ____________________________________________   FINAL CLINICAL IMPRESSION(S) / ED DIAGNOSES  Final diagnoses:  Depression, unspecified depression type     Note: This dictation was prepared with Dragon dictation. Any transcriptional errors that result from this process are unintentional     Nance Pear, MD 10/13/19 2057

## 2019-10-13 NOTE — Consult Note (Signed)
[facility] Consult   Reason for Consult:  Psych evaluation  Referring Physician:  Dr. Archie Balboa Patient Identification: Douglas Baldwin MRN:  [id number] Principal Diagnosis: Suicidal ideation Diagnosis:  Principal Problem:   Suicidal ideation   Total Time spent with patient: 45 minutes  Subjective:   Douglas Baldwin is a 39 y.o. male  Presents voluntary to [facility]. "please help me, I need help" H PI: Per EDP:  History limited by: [facility] Interpreter utilized Douglas Baldwin Douglas Baldwin is a 39 y.o. male who presents to the emergency department today because of concern for anxiety and depression. The patient states htat he has been dealing with anxiety and depression since last summer. He has been tried on multiple different medications without any significant relief. Was seen at [facility] roughly 10 days ago for similar complaints. States he has followed up with his psychiatrist since then.     Douglas Baldwin, 39 y.o., male patient seen via telepsych by this provider; chart reviewed and consulted with Dr. Archie Balboa on 10/13/19.  On evaluation Douglas Baldwin reports that he needs help. Patient is solemn and depressed. He reports that he has been like this for a long time. The medication is "somewhat helpful but no real relief for his feelings of doom and despair.  Patient was recently seen at [facility] where it was reported that although patient was being discharged, an inpatient hospitalization would also provide benefit to the patient.  Patient was told to follow-up with OP and that if symptoms persist to come back to the hospital.  At this time, patient is unable to contract for safety and has asked for help.  During evaluation Douglas Baldwin is sleeping in bed, he is easily aroused when name is called; he is alert/oriented x 4 andtearful, pt is depressed/cooperative/ and desperate for help; and mood congruent with affect.  Patient is spanish speaking so the  is an inherent language barrier.  However, patient was able to clearly state his need of help for his depressive and suicidal state.  Pt is speaking in a low tone with his head held down. Eye contact is minimal.  At this time patient is endorsing suicidal thoughts and cannot contract for safety.   Recommendation:  Psychiatric inpatient hospitalization  Past Psychiatric History: yes  Risk to Self:   Risk to Others:   Prior Inpatient Therapy:   Prior Outpatient Therapy:    Past Medical History:  Past Medical History:  Diagnosis Date  . Calculus (=stone) 05/02/2013  . Chronic prostatitis     Past Surgical History:  Procedure Laterality Date  . NO PAST SURGERIES     Family History: No family history on file. Family Psychiatric  History: unknown Social History:  Social History   Substance and Sexual Activity  Alcohol Use No  . Alcohol/week: 0.0 standard drinks     Social History   Substance and Sexual Activity  Drug Use No    Social History   Socioeconomic History  . Marital status: Married    Spouse name: Not on file  . Number of children: Not on file  . Years of education: Not on file  . Highest education level: Not on file  Occupational History  . Not on file  Tobacco Use  . Smoking status: Never Smoker  . Smokeless tobacco: Never Used  Substance and Sexual Activity  . Alcohol use: No    Alcohol/week: 0.0 standard drinks  . Drug use: No  . Sexual  activity: Not on file  Other Topics Concern  . Not on file  Social History Narrative  . Not on file   Social Determinants of Health   Financial Resource Strain:   . Difficulty of Paying Living Expenses: Not on file  Food Insecurity:   . Worried About Programme researcher, broadcasting/film/video in the Last Year: Not on file  . Ran Out of Food in the Last Year: Not on file  Transportation Needs:   . Lack of Transportation (Medical): Not on file  . Lack of Transportation (Non-Medical): Not on file  Physical Activity:   . Days of  Exercise per Week: Not on file  . Minutes of Exercise per Session: Not on file  Stress:   . Feeling of Stress : Not on file  Social Connections:   . Frequency of Communication with Friends and Family: Not on file  . Frequency of Social Gatherings with Friends and Family: Not on file  . Attends Religious Services: Not on file  . Active Member of Clubs or Organizations: Not on file  . Attends Banker Meetings: Not on file  . Marital Status: Not on file   Additional Social History:    Allergies:  No Known Allergies  Labs:  Results for orders placed or performed during the hospital encounter of 10/13/19 (from the past 48 hour(s))  Comprehensive metabolic panel     Status: Abnormal   Collection Time: 10/13/19  4:05 PM  Result Value Ref Range   Sodium 138 135 - 145 mmol/L   Potassium 4.4 3.5 - 5.1 mmol/L   Chloride 104 98 - 111 mmol/L   CO2 24 22 - 32 mmol/L   Glucose, Bld 110 (H) 70 - 99 mg/dL    Comment: Glucose reference range applies only to samples taken after fasting for at least 8 hours.   BUN 11 6 - 20 mg/dL   Creatinine, Ser 6.01 0.61 - 1.24 mg/dL   Calcium 09.3 8.9 - 23.5 mg/dL   Total Protein 8.7 (H) 6.5 - 8.1 g/dL   Albumin 5.3 (H) 3.5 - 5.0 g/dL   AST 17 15 - 41 U/L   ALT 20 0 - 44 U/L   Alkaline Phosphatase 68 38 - 126 U/L   Total Bilirubin 0.9 0.3 - 1.2 mg/dL   GFR calc non Af Amer >60 >60 mL/min   GFR calc Af Amer >60 >60 mL/min   Anion gap 10 5 - 15    Comment: Performed at [facility], [address] [place], [place] [address]  Ethanol     Status: None   Collection Time: 10/13/19  4:05 PM  Result Value Ref Range   Alcohol, Ethyl (B) <10 <10 mg/dL    Comment: (NOTE) Lowest detectable limit for serum alcohol is 10 mg/dL. For medical purposes only. Performed at [facility], [address] [place], [place] [address]   Salicylate level     Status: Abnormal   Collection Time: 10/13/19  4:05 PM  Result Value Ref Range    Salicylate Lvl <7.0 (L) 7.0 - 30.0 mg/dL    Comment: Performed at [facility], [address] [place], [place] [address]  Acetaminophen level     Status: Abnormal   Collection Time: 10/13/19  4:05 PM  Result Value Ref Range   Acetaminophen (Tylenol), Serum <10 (L) 10 - 30 ug/mL    Comment: (NOTE) Therapeutic concentrations vary significantly. A range of 10-30 ug/mL  may be an effective concentration  for many patients. However, some  are best treated at concentrations outside of this range. Acetaminophen concentrations >150 ug/mL at 4 hours after ingestion  and >50 ug/mL at 12 hours after ingestion are often associated with  toxic reactions. Performed at [facility], [address] [place], [place] [address]   cbc     Status: Abnormal   Collection Time: 10/13/19  4:05 PM  Result Value Ref Range   WBC 10.1 4.0 - 10.5 K/uL   RBC 5.50 4.22 - 5.81 MIL/uL   Hemoglobin 17.3 (H) 13.0 - 17.0 g/dL   HCT 92.4 26.8 - 34.1 %   MCV 90.4 80.0 - 100.0 fL   MCH 31.5 26.0 - 34.0 pg   MCHC 34.8 30.0 - 36.0 g/dL   RDW 96.2 22.9 - 79.8 %   Platelets 439 (H) 150 - 400 K/uL   nRBC 0.0 0.0 - 0.2 %    Comment: Performed at [facility], [address] [place], [place] [address]    No current facility-administered medications for this encounter.   Current Outpatient Medications  Medication Sig Dispense Refill  . Acetaminophen (TYLENOL PO) Take by mouth as needed.    . ondansetron (ZOFRAN) 4 MG tablet Take 1 tablet (4 mg total) by mouth every 8 (eight) hours as needed. (Patient not taking: Reported on 07/12/2019) 20 tablet 0  . polyethylene glycol (MIRALAX) 17 g packet Take 17 g by mouth daily. 30 each 1  . tamsulosin (FLOMAX) 0.4 MG CAPS capsule Take 1 capsule (0.4 mg total) by mouth daily. (Patient not taking: Reported on 07/12/2019) 90 capsule 3    Musculoskeletal: Strength & Muscle Tone: within normal limits Gait & Station: normal Patient leans:  N/A  Psychiatric Specialty Exam: Physical Exam  Nursing note and vitals reviewed. Constitutional: He is oriented to person, place, and time. He appears well-developed.  HENT:  Head: Normocephalic.  Eyes: Pupils are equal, round, and reactive to light.  Respiratory: Effort normal and breath sounds normal.  Musculoskeletal:        General: Normal range of motion.     Cervical back: Normal range of motion.  Neurological: He is alert and oriented to person, place, and time.  Skin: Skin is warm and dry.    Review of Systems  Psychiatric/Behavioral: Positive for dysphoric mood and suicidal ideas. The patient is nervous/anxious.   All other systems reviewed and are negative.   Blood pressure (!) 135/94, pulse 97, temperature 98.6 F (37 C), temperature source Oral, resp. rate 18, height 5\' 7"  (1.702 m), weight 72.6 kg, SpO2 99 %.Body mass index is 25.06 kg/m.  General Appearance: Guarded  Eye Contact:  Poor  Speech:  Blocked  Volume:  Decreased  Mood:  Depressed, Dysphoric, Hopeless and Worthless  Affect:  Congruent  Thought Process:  Coherent and Descriptions of Associations: Intact  Orientation:  Full (Time, Place, and Person)  Thought Content:  WDL  Suicidal Thoughts:  Yes.  without intent/plan  Homicidal Thoughts:  No  Memory:  Immediate;   Fair  Judgement:  Impaired  Insight:  Lacking  Psychomotor Activity:  Normal  Concentration:  Concentration: Poor  Recall:  of Knowledge:  Fair  Language:  Poor  Akathisia:  NA  Handed:  Right  AIMS (if indicated):     Assets:  Desire for Improvement  ADL's:  Intact  Cognition:  WNL  Sleep:        Treatment Plan Summary: Daily contact with patient to assess  and evaluate symptoms and progress in treatment, Medication management and Plan therapeutc interactions   Disposition: Recommend psychiatric Inpatient admission when medically cleared. Supportive therapy provided about ongoing stressors.  Jearld Lesch,  NP 10/13/2019 9:56 PM

## 2019-10-13 NOTE — ED Notes (Signed)
TTS in with pt at this time.

## 2019-10-13 NOTE — ED Notes (Signed)
Pt brought into ED BHU via sally port and wand with metal detector for safety by [facility] Security officer. Patient oriented to unit/care area: Pt informed of unit policies and procedures.  Informed that, for their safety, care areas are designed for safety and monitored by security cameras at all times. Patient verbalizes understanding, and verbal contract for safety obtained.Pt shown to their room.  

## 2019-10-14 ENCOUNTER — Inpatient Hospital Stay
Admission: RE | Admit: 2019-10-14 | Discharge: 2019-10-17 | DRG: 880 | Disposition: A | Discharge status: Home / Self Care | Payer: 59 | Source: Intra-hospital | Attending: Psychiatry | Admitting: Psychiatry

## 2019-10-14 ENCOUNTER — Other Ambulatory Visit: Payer: Self-pay

## 2019-10-14 ENCOUNTER — Encounter: Payer: Self-pay | Admitting: Psychiatric/Mental Health

## 2019-10-14 DIAGNOSIS — F411 Generalized anxiety disorder: Secondary | ICD-10-CM

## 2019-10-14 DIAGNOSIS — G4700 Insomnia, unspecified: Secondary | ICD-10-CM | POA: Diagnosis present

## 2019-10-14 DIAGNOSIS — Z20822 Contact with and (suspected) exposure to covid-19: Secondary | ICD-10-CM | POA: Diagnosis present

## 2019-10-14 DIAGNOSIS — F329 Major depressive disorder, single episode, unspecified: Secondary | ICD-10-CM | POA: Diagnosis present

## 2019-10-14 DIAGNOSIS — Z79899 Other long term (current) drug therapy: Secondary | ICD-10-CM

## 2019-10-14 DIAGNOSIS — F429 Obsessive-compulsive disorder, unspecified: Secondary | ICD-10-CM | POA: Diagnosis not present

## 2019-10-14 DIAGNOSIS — F458 Other somatoform disorders: Secondary | ICD-10-CM | POA: Diagnosis not present

## 2019-10-14 DIAGNOSIS — R45851 Suicidal ideations: Secondary | ICD-10-CM | POA: Diagnosis present

## 2019-10-14 DIAGNOSIS — Z818 Family history of other mental and behavioral disorders: Secondary | ICD-10-CM

## 2019-10-14 DIAGNOSIS — K581 Irritable bowel syndrome with constipation: Secondary | ICD-10-CM | POA: Diagnosis not present

## 2019-10-14 DIAGNOSIS — Z811 Family history of alcohol abuse and dependence: Secondary | ICD-10-CM | POA: Diagnosis not present

## 2019-10-14 DIAGNOSIS — E538 Deficiency of other specified B group vitamins: Secondary | ICD-10-CM | POA: Diagnosis present

## 2019-10-14 DIAGNOSIS — Z8719 Personal history of other diseases of the digestive system: Secondary | ICD-10-CM | POA: Diagnosis not present

## 2019-10-14 LAB — URINE DRUG SCREEN, QUALITATIVE (ARMC ONLY)
Amphetamines, Ur Screen: NOT DETECTED
Barbiturates, Ur Screen: NOT DETECTED
Benzodiazepine, Ur Scrn: POSITIVE — AB
Cannabinoid 50 Ng, Ur ~~LOC~~: NOT DETECTED
Cocaine Metabolite,Ur ~~LOC~~: NOT DETECTED
MDMA (Ecstasy)Ur Screen: NOT DETECTED
Methadone Scn, Ur: NOT DETECTED
Opiate, Ur Screen: NOT DETECTED
Phencyclidine (PCP) Ur S: NOT DETECTED
Tricyclic, Ur Screen: NOT DETECTED

## 2019-10-14 MED ORDER — ALUM & MAG HYDROXIDE-SIMETH 200-200-20 MG/5ML PO SUSP: 30.0000 mL | ORAL | Status: DC | PRN

## 2019-10-14 MED ORDER — HYDROXYZINE HCL 25 MG PO TABS
25.0000 mg | ORAL_TABLET | Freq: Three times a day (TID) | ORAL | Status: DC | PRN
  Administered 2019-10-14 – 2019-10-16 (×3): 25 mg via ORAL
  Filled 2019-10-14 (×3): qty 1

## 2019-10-14 MED ORDER — TRAZODONE HCL 50 MG PO TABS: 50.0000 mg | ORAL_TABLET | Freq: Every evening | ORAL | Status: DC | PRN

## 2019-10-14 MED ORDER — ACETAMINOPHEN 325 MG PO TABS: 650.0000 mg | ORAL_TABLET | Freq: Four times a day (QID) | ORAL | Status: DC | PRN

## 2019-10-14 MED ORDER — MAGNESIUM HYDROXIDE 400 MG/5ML PO SUSP: 30.0000 mL | Freq: Every day | ORAL | Status: DC | PRN

## 2019-10-14 NOTE — ED Provider Notes (Signed)
-----------------------------------------   5:53 AM on 10/14/2019 -----------------------------------------   Blood pressure 100/62, pulse 91, temperature 98.4 F (36.9 C), temperature source Oral, resp. rate 18, height 5\' 7"  (1.702 m), weight 72.6 kg, SpO2 99 %.  The patient is sleeping at this time.  There have been no acute events since the last update.  Awaiting disposition plan from Behavioral Medicine and/or Social Work team(s).   , MD 10/14/19 [phone]

## 2019-10-14 NOTE — BH Assessment (Signed)
Assessment Note  Douglas Baldwin is an 39 y.o. male.  male who presents to the emergency department today because of concern for anxiety and depression. Writer has utilized Passenger transport manager [phone] to engage with patient as he is primarily Spanish-speaking.Patient present to the emergency department with complaints of persistent and progressive symptoms of depression. He shares an own set of last year and states last year was very hard for me. He explains that he began to receive medication management approximately one month ago. Patient reports that he takes his medication as prescribed and that it helps with maintaining healthy sleep hygiene although he notes no relief from symptoms of anxiety or depression. Patient denied any previous Inpatient admissions but states that he began to speak with a counsellor at [facility] approximately 4 weeks ago. He denied any previous suicide attempts or active suicidally. Pt did state that he does sometimes think that he should overdose. He lives currently live with his wife and children and is currently employed full time. Patient reports absenteeism  from work due to depressed mood. He states that COVID-19 has been very overwhelming for him his family system. Patient meets criteria for impatient Admission this is been explained to him he is voiced understanding.   Diagnosis: Major Depression   Past Medical History:  Past Medical History:  Diagnosis Date  . Calculus (=stone) 05/02/2013  . Chronic prostatitis     Past Surgical History:  Procedure Laterality Date  . NO PAST SURGERIES      Family History: No family history on file.  Social History:  reports that he has never smoked. He has never used smokeless tobacco. He reports that he does not drink alcohol or use drugs.  Additional Social History:  Alcohol / Drug Use Pain Medications: SEE MAR Prescriptions: SEE MAR Over the Counter: SEE MAR History of alcohol  / drug use?: No history of alcohol / drug abuse  CIWA: CIWA-Ar BP: 100/62 Pulse Rate: 91 COWS:    Allergies: No Known Allergies  Home Medications: (Not in a hospital admission)   OB/GYN Status:  No LMP for male patient.  General Assessment Data Location of Assessment: [facility] ED TTS Assessment: In system Is this a Tele or Face-to-Face Assessment?: Face-to-Face Is this an Initial Assessment or a Re-assessment for this encounter?: Initial Assessment Patient Accompanied by:: N/A Language Other than English: No Living Arrangements: Other (Comment) What gender do you identify as?: Male Marital status: Married Living Arrangements: Spouse/significant other, Children Can pt return to current living arrangement?: Yes Admission Status: Voluntary Is patient capable of signing voluntary admission?: Yes Referral Source: Self/Family/Friend Insurance type: [facility]  Medical Screening Exam [facility] Walk-in ONLY) Medical Exam completed: Yes  Crisis Care Plan Living Arrangements: Spouse/significant other, Children Legal Guardian: Other: Name of Psychiatrist: Unknown  Name of Therapist: [facility]   Education Status Is patient currently in school?: No Is the patient employed, unemployed or receiving disability?: Employed  Risk to self with the past 6 months Suicidal Ideation: No-Not Currently/Within Last 6 Months Has patient been a risk to self within the past 6 months prior to admission? : Yes Suicidal Intent: No Has patient had any suicidal intent within the past 6 months prior to admission? : No Is patient at risk for suicide?: Yes Suicidal Plan?: No Has patient had any suicidal plan within the past 6 months prior to admission? : Yes Access to Means: Yes Specify Access to Suicidal Means: medications  What has been your use of drugs/alcohol  within the last 12 months?: none Previous Attempts/Gestures: No How many times?: 0 Other Self Harm Risks: none Intentional Self Injurious  Behavior: None Family Suicide History: No Recent stressful life event(s): Conflict (Comment), Trauma (Comment) Persecutory voices/beliefs?: No Depression: Yes Depression Symptoms: Feeling worthless/self pity, Loss of interest in usual pleasures, Guilt, Fatigue, Isolating, Despondent Substance abuse history and/or treatment for substance abuse?: No Suicide prevention information given to non-admitted patients: Not applicable  Risk to Others within the past 6 months Homicidal Ideation: No Does patient have any lifetime risk of violence toward others beyond the six months prior to admission? : No Thoughts of Harm to Others: No Current Homicidal Intent: No Current Homicidal Plan: No Identified Victim: no History of harm to others?: No Assessment of Violence: None Noted Violent Behavior Description: n Does patient have access to weapons?: No Criminal Charges Pending?: No Does patient have a court date: No Is patient on probation?: No  Psychosis Hallucinations: None noted Delusions: None noted  Mental Status Report Appearance/Hygiene: In scrubs Eye Contact: Fair Motor Activity: Unable to assess Speech: Logical/coherent Level of Consciousness: Alert Mood: Depressed Affect: Anxious, Depressed Anxiety Level: Moderate Thought Processes: Relevant Judgement: Partial Orientation: Time, Place, Person, Situation Obsessive Compulsive Thoughts/Behaviors: None  Cognitive Functioning Concentration: Good Memory: Recent Intact, Remote Intact Is patient IDD: No Insight: Poor Impulse Control: Fair Appetite: Poor Have you had any weight changes? : Loss Sleep: No Change Total Hours of Sleep: 6 Vegetative Symptoms: None  ADLScreening [facility] Assessment Services) Patient's cognitive ability adequate to safely complete daily activities?: Yes Patient able to express need for assistance with ADLs?: Yes Independently performs ADLs?: Yes (appropriate for developmental age)  Prior Inpatient  Therapy Prior Inpatient Therapy: No  Prior Outpatient Therapy Prior Outpatient Therapy: Yes Prior Therapy Dates: Current  Prior Therapy Facilty/Provider(s): [facility] Reason for Treatment: Depression Does patient have an ACCT team?: No Does patient have Intensive In-House Services?  : No Does patient have [facility] services? : No Does patient have P4CC services?: No  ADL Screening (condition at time of admission) Patient's cognitive ability adequate to safely complete daily activities?: Yes Patient able to express need for assistance with ADLs?: Yes Independently performs ADLs?: Yes (appropriate for developmental age)       Abuse/Neglect Assessment (Assessment to be complete while patient is alone) Abuse/Neglect Assessment Can Be Completed: Yes Physical Abuse: Denies Verbal Abuse: Denies Sexual Abuse: Denies Exploitation of patient/patient's resources: Denies Self-Neglect: Denies Values / Beliefs Cultural Requests During Hospitalization: None Spiritual Requests During Hospitalization: None Consults Spiritual Care Consult Needed: No Transition of Care Team Consult Needed: No Advance Directives (For Healthcare) Does Patient Have a Medical Advance Directive?: No Would patient like information on creating a medical advance directive?: No - Patient declined          Disposition:  Disposition Initial Assessment Completed for this Encounter: Yes Disposition of Patient: Admit Type of inpatient treatment program: Adult Patient refused recommended treatment: No  On Site Evaluation by:   Reviewed with Physician:    Laretta Alstrom 10/14/2019 1:39 AM

## 2019-10-14 NOTE — ED Notes (Signed)
Pt given lunch tray.

## 2019-10-14 NOTE — Progress Notes (Signed)
Patient admitted to unit. Alert and oriexted, spanish speaking but does understands some [place]. Denies SI, HI, AVH. Continues to ask for medication that is on outside prescription from psychiatrist. Pt states voluntarily came in for increased anxiety. Skin and contraband search completed. No skin issues noted no contraband found. Fluids and nutrition offered. Oriented patient to room and unit. Pt remains safe on unit with q 15 min checks.

## 2019-10-14 NOTE — ED Notes (Signed)
Pt discharged to BMU. VS stable. Pt is voluntary / Consent form signed.   Calm and cooperative with staff.  Report given to Lorenda Hatchet, Charity fundraiser. Belongings sent with patient.

## 2019-10-14 NOTE — BHH Group Notes (Signed)
LCSW Group Therapy Notes  Date and Time: 10/14/2019 1:00PM  Type of Therapy and Topic: Group Therapy: Healthy Vs. Unhealthy Coping Strategies  Participation Level: BHH PARTICIPATION LEVEL: Did Not Attend  Description of Group:  In this group, patients will be encouraged to explore their healthy and unhealthy coping strategics. Coping strategies are actions that we take to deal with stress, problems, or uncomfortable emotions in our daily lives. Each patient will be challenged to read some scenarios and discuss the unhealthy and healthy coping strategies within those scenarios. Also, each patient will be challenged to describe current healthy and unhealthy strategies that they use in their own lives and discuss the outcomes and barriers to those strategies. This group will be process-oriented, with patients participating in exploration of their own experiences as well as giving and receiving support and challenge from other group members.  Therapeutic Goals: 1. Patient will identify personal healthy and unhealthy coping strategies. 2. Patient will identify healthy and unhealthy coping strategies, in others, through scenarios.  3. Patient will identify expected outcomes of healthy and unhealthy coping strategies. 4. Patient will identify barriers to using healthy coping strategies.   Summary of Patient Progress:  X   Therapeutic Modalities:  Cognitive Behavioral Therapy Solution Focused Therapy Motivational Interviewing   Mont Dewell Monnier, MSW, LCSWA Clinical Social Worker    

## 2019-10-14 NOTE — BHH Group Notes (Signed)
BHH Group Notes:  (Nursing/MHT/Case Management/Adjunct)  Date:  10/14/2019  Time:  10:42 PM  Type of Therapy:  Group Therapy  Participation Level:  None  Participation Quality:  Pt. on the phone.   Mayra Neer 10/14/2019, 10:42 PM

## 2019-10-14 NOTE — Plan of Care (Signed)
D: Patient skin assessment completed with Marylu Lund RN, skin is intact, no contraband found. Patient denies SI/HI/AVH. Patient endorses   A: Patient oriented to unit/room/call light. Patient was offered support and encouragement. Patient was encourage to attend groups, participate in unit activities and continue with plan of care. Q x 15 minute observation checks were completed for safety.   R: Patient has no complaints at this time. Patient is receptive to treatment and safety maintained on unit.

## 2019-10-14 NOTE — ED Notes (Signed)
Patient is to be admitted to [facility] by Psychiatric Nurse Practitioner Rashaun.  Attending Physician will be Dr. Toni Amend.   Patient has been assigned to room 322, by [facility] Charge Nurse [place].   Intake Paper Work has been signed and placed on patient chart.  ER staff is aware of the admission:  ER Secretary    Dr. ER MD    Patient's Nurse    Patient Access.

## 2019-10-14 NOTE — Tx Team (Signed)
Initial Treatment Plan 10/14/2019 3:55 PM Kela Millin XHF:[id number]    PATIENT STRESSORS: Medication change or noncompliance Occupational concerns   PATIENT STRENGTHS: Average or above average intelligence Capable of independent living Communication skills   PATIENT IDENTIFIED PROBLEMS: Ineffective Coping skills  Unstable Mood                   DISCHARGE CRITERIA:  Ability to meet basic life and health needs Adequate post-discharge living arrangements Improved stabilization in mood, thinking, and/or behavior  PRELIMINARY DISCHARGE PLAN: Return to previous living arrangement Return to previous work or school arrangements  PATIENT/FAMILY INVOLVEMENT: This treatment plan has been presented to and reviewed with the patient, Douglas Baldwin.  The patient and family have been given the opportunity to ask questions and make suggestions.  Berkley Harvey, RN 10/14/2019, 3:55 PM

## 2019-10-14 NOTE — ED Notes (Signed)
Pt has signed for voluntary consent.

## 2019-10-14 NOTE — ED Notes (Signed)
Pt given breakfast tray

## 2019-10-15 DIAGNOSIS — F429 Obsessive-compulsive disorder, unspecified: Secondary | ICD-10-CM

## 2019-10-15 DIAGNOSIS — F411 Generalized anxiety disorder: Secondary | ICD-10-CM

## 2019-10-15 DIAGNOSIS — K581 Irritable bowel syndrome with constipation: Secondary | ICD-10-CM

## 2019-10-15 DIAGNOSIS — F458 Other somatoform disorders: Secondary | ICD-10-CM

## 2019-10-15 MED ORDER — ERGOCALCIFEROL 200 MCG/ML PO SOLN
2000.0000 [IU] | Freq: Every day | ORAL | Status: DC
  Administered 2019-10-15 – 2019-10-16 (×2): 2000 [IU] via ORAL
  Filled 2019-10-15 (×3): qty 0.25

## 2019-10-15 MED ORDER — PANTOPRAZOLE SODIUM 40 MG PO TBEC
40.0000 mg | DELAYED_RELEASE_TABLET | Freq: Every day | ORAL | Status: DC
  Administered 2019-10-15 – 2019-10-16 (×2): 40 mg via ORAL
  Filled 2019-10-15 (×2): qty 1

## 2019-10-15 MED ORDER — FOLIC ACID 1 MG PO TABS
1.0000 mg | ORAL_TABLET | Freq: Every day | ORAL | Status: DC
  Administered 2019-10-15 – 2019-10-16 (×2): 1 mg via ORAL
  Filled 2019-10-15 (×2): qty 1

## 2019-10-15 MED ORDER — POLYETHYLENE GLYCOL 3350 17 G PO PACK
17.0000 g | PACK | Freq: Every day | ORAL | Status: DC
  Filled 2019-10-15: qty 1

## 2019-10-15 MED ORDER — DICYCLOMINE HCL 10 MG PO CAPS
10.0000 mg | ORAL_CAPSULE | Freq: Three times a day (TID) | ORAL | Status: DC
  Administered 2019-10-15 – 2019-10-16 (×5): 10 mg via ORAL
  Filled 2019-10-15 (×7): qty 1

## 2019-10-15 MED ORDER — ZOLPIDEM TARTRATE 5 MG PO TABS
5.0000 mg | ORAL_TABLET | Freq: Every day | ORAL | Status: DC
  Administered 2019-10-15: 5 mg via ORAL
  Filled 2019-10-15: qty 1

## 2019-10-15 MED ORDER — DIAZEPAM 5 MG PO TABS: 2.5000 mg | ORAL_TABLET | Freq: Three times a day (TID) | ORAL | Status: DC | PRN

## 2019-10-15 MED ORDER — GABAPENTIN 100 MG PO CAPS
100.0000 mg | ORAL_CAPSULE | Freq: Three times a day (TID) | ORAL | Status: DC
  Administered 2019-10-15 – 2019-10-16 (×5): 100 mg via ORAL
  Filled 2019-10-15 (×5): qty 1

## 2019-10-15 MED ORDER — ONDANSETRON HCL 4 MG PO TABS: 4.0000 mg | ORAL_TABLET | Freq: Three times a day (TID) | ORAL | Status: DC | PRN

## 2019-10-15 MED ORDER — TAMSULOSIN HCL 0.4 MG PO CAPS
0.4000 mg | ORAL_CAPSULE | Freq: Every day | ORAL | Status: DC
  Administered 2019-10-15 – 2019-10-16 (×2): 0.4 mg via ORAL
  Filled 2019-10-15 (×2): qty 1

## 2019-10-15 NOTE — Plan of Care (Signed)
  Problem: Coping: Goal: Coping ability will improve Outcome: Progressing Goal: Will verbalize feelings Outcome: Progressing  D: Patient has been calm, pleasant and cooperative. Denies SI, HI and AVH. No complaints. A: Continue to monitor for safety R: Safety maintained.

## 2019-10-15 NOTE — H&P (Signed)
Psychiatric Admission Assessment Adult  Patient Identification: Douglas Baldwin MRN:  [id number] Date of Evaluation:  10/15/2019 Chief Complaint:  MDD (major depressive disorder) [F32.9] Principal Diagnosis: <principal problem not specified> Diagnosis:  Active Problems:   MDD (major depressive disorder)  History of Present Illness: Patient is seen and examined.  Patient is a 39 year old Hispanic male admitted on 10/14/2019 secondary to continued problems with anxiety and suicidal ideation.  The patient was seen with an interpreter today.  The patient stated that he had been anxious since the COVID-19 pandemic.  He stated that he had had multiple somatic symptoms including abdominal pain, dizziness, problems with urination.  He had been seen at the [facility], and had had multiple tests done.  This included an MRI which was essentially normal.  He was treated for prostatitis and given antibiotics and had some degree of improvement.  He also saw someone for physical therapy for what sounds like Kegel maneuvers to control urination.  He has been seen [facility] an [facility] as well as [facility] on several occasions.  Review of the electronic medical record as well as the patient shows that he has been treated with sertraline, buspirone, Seroquel, diazepam and multiple other medications without success.  He stated that if he does not get these things under control he may harm himself.  He did receive diazepam on 2/23 sixty tablets, but has not had relief from the symptoms.  Previously he had also received clonazepam on 07/11/2019.  He does have some form of a vitamin B 12 deficiency and has received cyanocobalamin injections.  His last injection was on 09/21/2019.  Besides the diazepam his most recent medications included mirtazapine on 09/11/2019.  In that note it also stated that he had been treated with venlafaxine as well as sertraline.  He had side  effects to both of those.  As stated above he had an MRI without contrast in January secondary to dizziness.  At that time he was also taking MiraLAX and Flomax.  He had also been given as needed Zofran.  Review of the electronic medical record also revealed that he had been previously treated with Seroquel as well as trazodone and Lexapro.  He denied any history of sexual trauma.  Through our discussion today it sounds as though that he has been anxious for many years.  He stated that he has a brother with anxiety as well as alcohol problems.  He denied any drug use or alcohol use.  Nursing notes reflect that he slept 7-1/2 hours last night, but the patient does not believe that he slept well.  He denied any auditory or visual hallucinations.  He denied any flashbacks or nightmares.  Most of his complaints of side effects have to do with GI, prostate issues or constipation.  He was admitted to the hospital for evaluation and stabilization.  Associated Signs/Symptoms: Depression Symptoms:  depressed mood, anhedonia, insomnia, psychomotor agitation, fatigue, difficulty concentrating, hopelessness, suicidal thoughts without plan, anxiety, loss of energy/fatigue, disturbed sleep, (Hypo) Manic Symptoms:  Impulsivity, Anxiety Symptoms:  Excessive Worry, Obsessive Compulsive Symptoms:   Somatic concerns, Psychotic Symptoms:  Denied PTSD Symptoms: Negative Total Time spent with patient: 1 hour  Past Psychiatric History: Patient stated he has been [facility] well as [facility] for psychiatric reasons.  No previous psychiatric admissions.  Multiple psychiatric medications used so far.  Is the patient at risk to self? No.  Has the patient been a risk to self in the past 6 months? No.  Has the patient been a risk to self within the distant past? No.  Is the patient a risk to others? No.  Has the patient been a risk to others in the past 6 months? No.  Has the  patient been a risk to others within the distant past? No.   Prior Inpatient Therapy:   Prior Outpatient Therapy:    Alcohol Screening: 1. How often do you have a drink containing alcohol?: Never 2. How many drinks containing alcohol do you have on a typical day when you are drinking?: 1 or 2 3. How often do you have six or more drinks on one occasion?: Never AUDIT-C Score: 0 9. Have you or someone else been injured as a result of your drinking?: No 10. Has a relative or friend or a doctor or another health worker been concerned about your drinking or suggested you cut down?: No Alcohol Use Disorder Identification Test Final Score (AUDIT): 0 Alcohol Brief Interventions/Follow-up: AUDIT Score <7 follow-up not indicated Substance Abuse History in the last 12 months:  No. Consequences of Substance Abuse: Negative Previous Psychotropic Medications: Yes  Psychological Evaluations: Yes  Past Medical History:  Past Medical History:  Diagnosis Date  . Calculus (=stone) 05/02/2013  . Chronic prostatitis     Past Surgical History:  Procedure Laterality Date  . NO PAST SURGERIES     Family History: History reviewed. No pertinent family history. Family Psychiatric  History: Patient reported brother had anxiety as well as alcohol problems. Tobacco Screening: Have you used any form of tobacco in the last 30 days? (Cigarettes, Smokeless Tobacco, Cigars, and/or Pipes): No Social History:  Social History   Substance and Sexual Activity  Alcohol Use No  . Alcohol/week: 0.0 standard drinks     Social History   Substance and Sexual Activity  Drug Use No    Additional Social History:                           Allergies:  No Known Allergies Lab Results:  Results for orders placed or performed during the hospital encounter of 10/13/19 (from the past 48 hour(s))  Comprehensive metabolic panel     Status: Abnormal   Collection Time: 10/13/19  4:05 PM  Result Value Ref Range    Sodium 138 135 - 145 mmol/L   Potassium 4.4 3.5 - 5.1 mmol/L   Chloride 104 98 - 111 mmol/L   CO2 24 22 - 32 mmol/L   Glucose, Bld 110 (H) 70 - 99 mg/dL    Comment: Glucose reference range applies only to samples taken after fasting for at least 8 hours.   BUN 11 6 - 20 mg/dL   Creatinine, Ser 4.43 0.61 - 1.24 mg/dL   Calcium 15.4 8.9 - 00.8 mg/dL   Total Protein 8.7 (H) 6.5 - 8.1 g/dL   Albumin 5.3 (H) 3.5 - 5.0 g/dL   AST 17 15 - 41 U/L   ALT 20 0 - 44 U/L   Alkaline Phosphatase 68 38 - 126 U/L   Total Bilirubin 0.9 0.3 - 1.2 mg/dL   GFR calc non Af Amer >60 >60 mL/min   GFR calc Af Amer >60 >60 mL/min   Anion gap 10 5 - 15    Comment: Performed at [facility], [address] [place], [place] [address]  Ethanol     Status:  None   Collection Time: 10/13/19  4:05 PM  Result Value Ref Range   Alcohol, Ethyl (B) <10 <10 mg/dL    Comment: (NOTE) Lowest detectable limit for serum alcohol is 10 mg/dL. For medical purposes only. Performed at [facility], [address] [place], [place] [address]   Salicylate level     Status: Abnormal   Collection Time: 10/13/19  4:05 PM  Result Value Ref Range   Salicylate Lvl <6.5 (L) 7.0 - 30.0 mg/dL    Comment: Performed at [facility], [address] [place], [place] [address]  Acetaminophen level     Status: Abnormal   Collection Time: 10/13/19  4:05 PM  Result Value Ref Range   Acetaminophen (Tylenol), Serum <10 (L) 10 - 30 ug/mL    Comment: (NOTE) Therapeutic concentrations vary significantly. A range of 10-30 ug/mL  may be an effective concentration for many patients. However, some  are best treated at concentrations outside of this range. Acetaminophen concentrations >150 ug/mL at 4 hours after ingestion  and >50 ug/mL at 12 hours after ingestion are often associated with  toxic reactions. Performed at [facility], [address] [place], [place] [address]   cbc     Status: Abnormal    Collection Time: 10/13/19  4:05 PM  Result Value Ref Range   WBC 10.1 4.0 - 10.5 K/uL   RBC 5.50 4.22 - 5.81 MIL/uL   Hemoglobin 17.3 (H) 13.0 - 17.0 g/dL   HCT 49.7 39.0 - 52.0 %   MCV 90.4 80.0 - 100.0 fL   MCH 31.5 26.0 - 34.0 pg   MCHC 34.8 30.0 - 36.0 g/dL   RDW 11.5 11.5 - 15.5 %   Platelets 439 (H) 150 - 400 K/uL   nRBC 0.0 0.0 - 0.2 %    Comment: Performed at [facility], [address] [place], [place] [address]  Respiratory Panel by RT PCR (Flu A&B, Covid) - Nasopharyngeal Swab     Status: None   Collection Time: 10/13/19 10:24 PM   Specimen: Nasopharyngeal Swab  Result Value Ref Range   SARS Coronavirus 2 by RT PCR NEGATIVE NEGATIVE    Comment: (NOTE) SARS-CoV-2 target nucleic acids are NOT DETECTED. The SARS-CoV-2 RNA is generally detectable in upper respiratoy specimens during the acute phase of infection. The lowest concentration of SARS-CoV-2 viral copies this assay can detect is 131 copies/mL. A negative result does not preclude SARS-Cov-2 infection and should not be used as the sole basis for treatment or other patient management decisions. A negative result may occur with  improper specimen collection/handling, submission of specimen other than nasopharyngeal swab, presence of viral mutation(s) within the areas targeted by this assay, and inadequate number of viral copies (<131 copies/mL). A negative result must be combined with clinical observations, patient history, and epidemiological information. The expected result is Negative. Fact Sheet for Patients:  PinkCheek.be Fact Sheet for Healthcare Providers:  GravelBags.it This test is not yet ap proved or cleared by the [place] FDA and  has been authorized for detection and/or diagnosis of SARS-CoV-2 by FDA under an Emergency Use Authorization (EUA). This EUA will remain  in effect (meaning this test can be used) for the duration of  the COVID-19 declaration under Section 564(b)(1) of the Act, 21 U.S.C. section 360bbb-3(b)(1), unless the authorization is terminated or revoked sooner.    Influenza A by PCR NEGATIVE NEGATIVE   Influenza B by PCR NEGATIVE NEGATIVE    Comment: (NOTE)  The Xpert Xpress SARS-CoV-2/FLU/RSV assay is intended as an aid in  the diagnosis of influenza from Nasopharyngeal swab specimens and  should not be used as a sole basis for treatment. Nasal washings and  aspirates are unacceptable for Xpert Xpress SARS-CoV-2/FLU/RSV  testing. Fact Sheet for Patients: https://www.moore.com/https://www.fda.gov/media/142436/download Fact Sheet for Healthcare Providers: https://www.young.biz/https://www.fda.gov/media/142435/download This test is not yet approved or cleared by the [place] FDA and  has been authorized for detection and/or diagnosis of SARS-CoV-2 by  FDA under an Emergency Use Authorization (EUA). This EUA will remain  in effect (meaning this test can be used) for the duration of the  Covid-19 declaration under Section 564(b)(1) of the Act, 21  U.S.C. section 360bbb-3(b)(1), unless the authorization is  terminated or revoked. Performed at [facility], [address] [place], [place] [address]   Urine Drug Screen, Qualitative     Status: Abnormal   Collection Time: 10/14/19 12:28 AM  Result Value Ref Range   Tricyclic, Ur Screen NONE DETECTED NONE DETECTED   Amphetamines, Ur Screen NONE DETECTED NONE DETECTED   MDMA (Ecstasy)Ur Screen NONE DETECTED NONE DETECTED   Cocaine Metabolite,Ur [place] NONE DETECTED NONE DETECTED   Opiate, Ur Screen NONE DETECTED NONE DETECTED   Phencyclidine (PCP) Ur S NONE DETECTED NONE DETECTED   Cannabinoid 50 Ng, Ur [place] NONE DETECTED NONE DETECTED   Barbiturates, Ur Screen NONE DETECTED NONE DETECTED   Benzodiazepine, Ur Scrn POSITIVE (A) NONE DETECTED   Methadone Scn, Ur NONE DETECTED NONE DETECTED    Comment: (NOTE) Tricyclics + metabolites, urine    Cutoff 1000 ng/mL Amphetamines +  metabolites, urine  Cutoff 1000 ng/mL MDMA (Ecstasy), urine              Cutoff 500 ng/mL Cocaine Metabolite, urine          Cutoff 300 ng/mL Opiate + metabolites, urine        Cutoff 300 ng/mL Phencyclidine (PCP), urine         Cutoff 25 ng/mL Cannabinoid, urine                 Cutoff 50 ng/mL Barbiturates + metabolites, urine  Cutoff 200 ng/mL Benzodiazepine, urine              Cutoff 200 ng/mL Methadone, urine                   Cutoff 300 ng/mL The urine drug screen provides only a preliminary, unconfirmed analytical test result and should not be used for non-medical purposes. Clinical consideration and professional judgment should be applied to any positive drug screen result due to possible interfering substances. A more specific alternate chemical method must be used in order to obtain a confirmed analytical result. Gas chromatography / mass spectrometry (GC/MS) is the preferred confirmat ory method. Performed at [facility], [address] [place], [place] [address]     Blood Alcohol level:  Lab Results  Component Value Date   [facility]    Metabolic Disorder Labs:  No results found for: HGBA1C, MPG No results found for: PROLACTIN No results found for: CHOL, TRIG, HDL, CHOLHDL, VLDL, LDLCALC  Current Medications: Current Facility-Administered Medications  Medication Dose Route Frequency Provider Last Rate Last Admin  . acetaminophen (TYLENOL) tablet 650 mg  650 mg Oral Q6H PRN Dixon, Rashaun M, NP      . alum & mag hydroxide-simeth (MAALOX/MYLANTA) 200-200-20 MG/5ML suspension 30 mL  30 mL Oral Q4H PRN Jearld Leschixon, Rashaun M, NP      .  diazepam (VALIUM) tablet 2.5 mg  2.5 mg Oral Q8H PRN Antonieta Pert, MD      . dicyclomine (BENTYL) capsule 10 mg  10 mg Oral TID AC & HS Antonieta Pert, MD      . ergocalciferol (DRISDOL) 200 MCG/ML drops 2,000 Units  2,000 Units Oral Daily Antonieta Pert, MD      . folic acid (FOLVITE) tablet 1 mg  1 mg Oral  Daily Antonieta Pert, MD   1 mg at 10/15/19 1245  . gabapentin (NEURONTIN) capsule 100 mg  100 mg Oral TID Antonieta Pert, MD   100 mg at 10/15/19 1244  . hydrOXYzine (ATARAX/VISTARIL) tablet 25 mg  25 mg Oral TID PRN Jearld Lesch, NP   25 mg at 10/15/19 1245  . magnesium hydroxide (MILK OF MAGNESIA) suspension 30 mL  30 mL Oral Daily PRN Dixon, Rashaun M, NP      . ondansetron (ZOFRAN) tablet 4 mg  4 mg Oral Q8H PRN Antonieta Pert, MD      . pantoprazole (PROTONIX) EC tablet 40 mg  40 mg Oral Daily Antonieta Pert, MD   40 mg at 10/15/19 1245  . polyethylene glycol (MIRALAX / GLYCOLAX) packet 17 g  17 g Oral Daily Antonieta Pert, MD      . tamsulosin [facility]) capsule 0.4 mg  0.4 mg Oral QPC supper Antonieta Pert, MD      . traZODone (DESYREL) tablet 50 mg  50 mg Oral QHS PRN Jearld Lesch, NP      . zolpidem (AMBIEN) tablet 5 mg  5 mg Oral QHS Antonieta Pert, MD       PTA Medications: Medications Prior to Admission  Medication Sig Dispense Refill Last Dose  . QUEtiapine (SEROQUEL) 100 MG tablet Take 100 mg by mouth at bedtime.   Past Week at Unknown time  . Acetaminophen (TYLENOL PO) Take by mouth as needed.     . diazepam (VALIUM) 5 MG tablet Take 5 mg by mouth 2 (two) times daily as needed.     Marland Kitchen escitalopram (LEXAPRO) 5 MG tablet Take 2.5 mg by mouth daily.     . mirtazapine (REMERON) 15 MG tablet Take 15 mg by mouth at bedtime.     . ondansetron (ZOFRAN) 4 MG tablet Take 1 tablet (4 mg total) by mouth every 8 (eight) hours as needed. (Patient not taking: Reported on 07/12/2019) 20 tablet 0   . polyethylene glycol (MIRALAX) 17 g packet Take 17 g by mouth daily. 30 each 1   . tamsulosin (FLOMAX) 0.4 MG CAPS capsule Take 1 capsule (0.4 mg total) by mouth daily. (Patient not taking: Reported on 07/12/2019) 90 capsule 3   . Vitamin D, Ergocalciferol, (DRISDOL) 1.25 MG (50000 UNIT) CAPS capsule Take 50,000 Units by mouth once a week.        Musculoskeletal: Strength & Muscle Tone: within normal limits Gait & Station: normal Patient leans: N/A  Psychiatric Specialty Exam: Physical Exam  Nursing note and vitals reviewed. Constitutional: He is oriented to person, place, and time. He appears well-developed and well-nourished.  HENT:  Head: Normocephalic and atraumatic.  Respiratory: Effort normal.  Neurological: He is alert and oriented to person, place, and time.    Review of Systems  Blood pressure (!) 123/91, pulse 72, temperature 98 F (36.7 C), temperature source Oral, resp. rate 18, height 5\' 7"  (1.702 m), weight 71.7 kg, SpO2 100 %.Body mass index is 24.75 kg/m.  General Appearance: Casual  Eye Contact:  Good  Speech:  Normal Rate  Volume:  Normal  Mood:  Anxious and Depressed  Affect:  Congruent  Thought Process:  Coherent and Descriptions of Associations: Intact  Orientation:  Full (Time, Place, and Person)  Thought Content:  Rumination  Suicidal Thoughts:  Yes.  without intent/plan  Homicidal Thoughts:  No  Memory:  Immediate;   Fair Recent;   Fair Remote;   Fair  Judgement:  Impaired  Insight:  Lacking  Psychomotor Activity:  Increased  Concentration:  Concentration: Good and Attention Span: Good  Recall:  Good  Fund of Knowledge:  Good  Language:  Good  Akathisia:  Negative  Handed:  Right  AIMS (if indicated):     Assets:  Desire for Improvement Housing Resilience  ADL's:  Intact  Cognition:  WNL  Sleep:  Number of Hours: 7.45    Treatment Plan Summary: Daily contact with patient to assess and evaluate symptoms and progress in treatment, Medication management and Plan : Patient is seen and examined.  Patient is a 39 year old male with the above-stated past psychiatric history who was admitted secondary to anxiety, depression and suicidal thoughts.  He will be admitted to the hospital.  He will be integrated into the milieu.  He will be encouraged to attend groups.  In addition to his  anxiety I think he probably has irritable bowel syndrome.  I am going to start him back on MiraLAX daily, but also add Bentyl.  I will also add Protonix just in case.  From a psychiatric perspective I think we have to approach this differently.  He has not been treated with a mood stabilizer in the past, and I think Neurontin would be a good place to start.  We will start Neurontin 100 mg p.o. 3 times daily.  Hopefully at that dose he will not get any urinary retention.  He has been on mirtazapine in the past.  He did take Seroquel 100 mg p.o. nightly, but that apparently was stopped secondary to side effects.  He most recently has received diazepam 5 mg p.o. twice daily.  I have explained to him my concern that for this severe of an anxiety problem that he may become dependent upon it.  I will write for Ambien 5 mg p.o. nightly for sleep to see if that helps.  I will hold on any SSRIs at least at this point.  We will also continue oral folic acid and thiamine as well as vitamin D.  We will also continue his Flomax.  Review of his admission laboratories showed electrolytes that were all essentially normal, a CBC that was essentially normal except for mildly elevated platelets at 439,000.  Previously 3 months ago they were at 400,000.  Acetaminophen and salicylate were both negative.  Blood alcohol was less than 10.  Drug screen was negative except for benzodiazepines.  I will go on and write for diazepam 2.5 mg p.o. 3 times daily as needed just to make sure that he does not go into any diazepam withdrawal syndromes.  Observation Level/Precautions:  15 minute checks  Laboratory:  Chemistry Profile  Psychotherapy:    Medications:    Consultations:    Discharge Concerns:    Estimated LOS:  Other:     Physician Treatment Plan for Primary Diagnosis: <principal problem not specified> Long Term Goal(s): Improvement in symptoms so as ready for discharge  Short Term Goals: Ability to identify changes in  lifestyle to reduce recurrence  of condition will improve, Ability to verbalize feelings will improve, Ability to disclose and discuss suicidal ideas, Ability to demonstrate self-control will improve, Ability to identify and develop effective coping behaviors will improve, Ability to maintain clinical measurements within normal limits will improve and Compliance with prescribed medications will improve  Physician Treatment Plan for Secondary Diagnosis: Active Problems:   MDD (major depressive disorder)  Long Term Goal(s): Improvement in symptoms so as ready for discharge  Short Term Goals: Ability to identify changes in lifestyle to reduce recurrence of condition will improve, Ability to verbalize feelings will improve, Ability to disclose and discuss suicidal ideas, Ability to demonstrate self-control will improve, Ability to identify and develop effective coping behaviors will improve, Ability to maintain clinical measurements within normal limits will improve and Compliance with prescribed medications will improve  I certify that inpatient services furnished can reasonably be expected to improve the patient's condition.    Antonieta Pert, MD 3/7/20211:29 PM

## 2019-10-15 NOTE — Progress Notes (Signed)
D: Patient has been calm, pleasant and cooperative. Denies SI, HI and AVH. No complaints. A: Continue to monitor for safety R: Safety maintained.

## 2019-10-15 NOTE — Progress Notes (Signed)
CSW spoke with pt, with interpret present. The pt reports a desire to return home due to fear of loosing his job. Pt stated he is currently using his vacation time to be here. In addition, pt mentioned his wife and daughter has a birthday on Tuesday that he does not want to miss. Pt would like to have follow up services with [facility] in [place], [place].  Teresita Madura, MSW, [employer] Clinical Social Worker

## 2019-10-15 NOTE — Plan of Care (Signed)
Patient was sad and tearful this morning.Stated that he is being anxious since the covid started.Denies SI,HI and AVH.By the after noon patient more visible in the milieu and interacting with staff & peers.Compliant with medications.Appetite and energy level good.Support and encouragement given.

## 2019-10-15 NOTE — [facility] Counselor (Signed)
Adult Comprehensive Assessment  Patient ID: [facility], male   DOB: 1981-07-01, 39 y.o.   MRN: [id number]  Information Source: Information source: Patient  Current Stressors:  Patient states their primary concerns and needs for treatment are:: Pt reports "I am being treated for anxiety and depression with a lot of medication. I dont know if I gave it enough time to work but I started to feel really bad" Patient states their goals for this hospitilization and ongoing recovery are:: Pt reports "I just want to feel like I used to feel before. i want to find medicine or therapy that is effective and I want to go back home" Educational / Learning stressors: Pt reports "none" Employment / Job issues: Pt reports "yes, because I dont really know what is going to happen with my job and I am using my vacation time to be here" Family Relationships: Pt reports "yes a little. Before all of this i would work and provide, but now my wife has to work while i deal woth all of thisMarine scientist / Lack of resources (include bankruptcy): Pt reports "I am missing workArt therapist / Lack of housing: Pt reports "none" Physical health (include injuries & life threatening diseases): Pt reports "just from [place] I have been given, it is causing som iiritation. I told the doctor about this. I was told I have an enlarged prostate back in July 2020." Social relationships: Pt reports "none" Substance abuse: Pt reports "none" Bereavement / Loss: Pt reports "last year my wife's sister in law had cancer last year and five days ago she passed away. 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 The issues of having to work because of me dealing weith mhy mental health causes some strees. ALo my son's grades are slipping since being in school from home. I am nervouse baout him getting sick while at school." Are you sexually active?: Yes What is your sexual orientation?: Pt reports "heterosexual" Has your sexual activity been affected by drugs, alcohol, medication, or emotional stress?: Pt reports "the medicine cause it not to feel the same" Does patient have children?: Yes How many children?: 2 How is patient's relationship with their children?: Pt reports "its good. I am happy with them. Due to the stress, I dont have the same enjoyment I used to have with them, but its hard for me with them because of what I am going through."  Childhood History:  By whom was/is the patient raised?: Both parents Description of patient's relationship with caregiver when they were a child: Pt reports "my dad would go to the [place] to work for a year or two at a time. We were mostly raised by my mom. She was kind and tender and my dad was more sternwith [place]." Patient's description of current relationship with people who raised him/her: Pt reports "They have been one of my business supports." How were you disciplined when you got in trouble as a child/adolescent?: Pt  reports "my father was strict, but he tried to take advantage of the time he was with [place] in [place]." Does patient have siblings?: Yes Number of Siblings: 5 Description of patient's current relationship with siblings: Pt reports "I have a good relationship with one of my borthers because he is [place] in the [place]. The others are still in [place]. 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" Has patient ever been sexually abused/assaulted/raped as an adolescent or adult?: No Was the patient ever a victim of a crime or a disaster?: No Witnessed domestic violence?: No  Education:  Highest grade of school patient has completed: Pt reports "8th grade" Currently a student?: No Learning disability?: No(Pt reports "I've had a lot of trouble learning English.")  Employment/Work Situation:   What is the longest time patient has a held a job?: Pt reports "11 years" 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" Did You Receive Any Psychiatric Treatment/Services While in the Military?: No Are There Guns or Other Weapons in Your Home?: No  Financial Resources:   Financial resources: Income from employment, Media planner Does patient have a Lawyer or guardian?: No  Alcohol/Substance Abuse:      Social Support System:   Patient's Community Support System: Good Describe Community Support System: Pt reports "my wife, my parents, and my siblings, and my cousins" Type of faith/religion: Pt reports "Yes, I am a Christian" How does patient's faith help to cope with current illness?: Pt reports "God really sustains me. I have a really good relationship with Jesus."  Leisure/Recreation:   Leisure and Hobbies: Pt reports "Yes, I play piano, and sing in the choir, I really like to sing. I go play soccer with my son sometimes. I really like to listent to music."  Strengths/Needs:   What is the patient's perception of their strengths?: Pt reports " I work hard for my family" Patient states they can use these personal strengths during their treatment to contribute to their recovery: Pt reports "my family is really supporting  me a lot" Patient states these barriers may affect/interfere with their treatment: Pt reports "the language is the only thing."  Discharge Plan:   Currently receiving community mental health services: Yes (From Whom) Patient states they will know when they are safe and ready for discharge when: Pt reports "Whatever the doctors feel like I am ready. As far as I am concerned the faster the better" Does patient have access to transportation?: Yes Does patient have financial barriers related to discharge medications?: No Will patient be returning to same living situation after discharge?: Yes  Summary/Recommendations:   Summary and Recommendations (to be completed by the evaluator): Patient is a 39 year old Spanish speaking only male, from [place] but resides in [place], [place] [facility]). He is married and has a full time job . Patient [facility] to [facility] with anxiety and suicidal ideations. He has a primary diagnosis of MDD (major depressive disorder). Recommendations include crisis stabilization, therapeutic milieu, encourage group attendance and participation, medication management for detox/mood stabilization and development of comprehensive mental wellness/sobriety plan.  Yamilka Lopiccolo P Bodey Frizell. 10/15/2019

## 2019-10-15 NOTE — [facility] Group Notes (Signed)
LCSW Group Therapy Note  10/15/2019 1:00PM  Type of Therapy/Topic:  Group Therapy:  Balance in Life  Participation Level:  Active  Description of Group:    This group will address the concept of balance and how it feels and looks when one is unbalanced. Patients will be encouraged to process areas in their lives that are out of balance and identify reasons for remaining unbalanced. Facilitators will guide patients in utilizing problem-solving interventions to address and correct the stressor making their life unbalanced. Understanding and applying boundaries will be explored and addressed for obtaining and maintaining a balanced life. Patients will be encouraged to explore ways to assertively make their unbalanced needs known to significant others in their lives, using other group members and facilitator for support and feedback.  Therapeutic Goals: 1. Patient will identify two or more emotions or situations they have that consume much of in their lives. 2. Patient will identify signs/triggers that life has become out of balance:  3. Patient will identify two ways to set boundaries in order to achieve balance in their lives:  4. Patient will demonstrate ability to communicate their needs through discussion and/or role plays  Summary of Patient Progress:  The pt stated his English was not "good", but he would try his best to follow along. The pt was completed the wellness wheel handout. The pt identified some strength areas on his wheel of balance, which included his family relationships. The pt reported that he is grateful to have a supportive family to help him with his mental health challenges.     Therapeutic Modalities:   Cognitive Behavioral Therapy Solution-Focused Therapy Assertiveness Training  Teresita Madura MSW, [place] Clinical Social Work 10/15/2019 11:58 AM

## 2019-10-15 NOTE — [facility] Suicide Risk Assessment (Signed)
[facility] Admission Suicide Risk Assessment   Nursing information obtained from:    Demographic factors:  Male, Low socioeconomic status Current Mental Status:  NA Loss Factors:  Financial problems / change in socioeconomic status Historical Factors:  NA Risk Reduction Factors:  NA  Total Time spent with patient: 1 hour Principal Problem: <principal problem not specified> Diagnosis:  Active Problems:   MDD (major depressive disorder)  Subjective Data: Patient is seen and examined.  Patient is a 39 year old Hispanic male admitted on 10/14/2019 secondary to continued problems with anxiety and suicidal ideation.  The patient was seen with an interpreter today.  The patient stated that he had been anxious since the COVID-19 pandemic.  He stated that he had had multiple somatic symptoms including abdominal pain, dizziness, problems with urination.  He had been seen at the [facility], and had had multiple tests done.  This included an MRI which was essentially normal.  He was treated for prostatitis and given antibiotics and had some degree of improvement.  He also saw someone for physical therapy for what sounds like Kegel maneuvers to control urination.  He has been seen at an outpatient clinic called [facility] as well as the [facility] emergency room on several occasions.  Review of the electronic medical record as well as the patient shows that he has been treated with sertraline, buspirone, Seroquel, diazepam and multiple other medications without success.  He stated that if he does not get these things under control he may harm himself.  He did receive diazepam on 2/23 sixty tablets, but has not had relief from the symptoms.  Previously he had also received clonazepam on 07/11/2019.  He does have some form of a vitamin B 12 deficiency and has received cyanocobalamin injections.  His last injection was on 09/21/2019.  Besides the diazepam his most recent medications included mirtazapine on  09/11/2019.  In that note it also stated that he had been treated with venlafaxine as well as sertraline.  He had side effects to both of those.  As stated above he had an MRI without contrast in January secondary to dizziness.  At that time he was also taking MiraLAX and Flomax.  He had also been given as needed Zofran.  Review of the electronic medical record also revealed that he had been previously treated with Seroquel as well as trazodone and Lexapro.  He denied any history of sexual trauma.  Through our discussion today it sounds as though that he has been anxious for many years.  He stated that he has a brother with anxiety as well as alcohol problems.  He denied any drug use or alcohol use.  Nursing notes reflect that he slept 7-1/2 hours last night, but the patient does not believe that he slept well.  He denied any auditory or visual hallucinations.  He denied any flashbacks or nightmares.  Most of his complaints of side effects have to do with GI, prostate issues or constipation.  He was admitted to the hospital for evaluation and stabilization.  Continued Clinical Symptoms:  Alcohol Use Disorder Identification Test Final Score (AUDIT): 0 The "Alcohol Use Disorders Identification Test", Guidelines for Use in Primary Care, Second Edition.  [facility]). Score between 0-7:  no or low risk or alcohol related problems. Score between 8-15:  moderate risk of alcohol related problems. Score between 16-19:  high risk of alcohol related problems. Score 20 or above:  warrants further diagnostic evaluation for alcohol dependence and  treatment.   CLINICAL FACTORS:   Severe Anxiety and/or Agitation Depression:   Anhedonia Hopelessness Impulsivity Insomnia   Musculoskeletal: Strength & Muscle Tone: within normal limits Gait & Station: normal Patient leans: N/A  Psychiatric Specialty Exam: Physical Exam  Nursing note and vitals reviewed. Constitutional: He is oriented to  person, place, and time. He appears well-developed and well-nourished.  HENT:  Head: Normocephalic and atraumatic.  Respiratory: Effort normal.  Neurological: He is alert and oriented to person, place, and time.    Review of Systems  Blood pressure (!) 123/91, pulse 72, temperature 98 F (36.7 C), temperature source Oral, resp. rate 18, height 5\' 7"  (1.702 m), weight 71.7 kg, SpO2 100 %.Body mass index is 24.75 kg/m.  General Appearance: Casual  Eye Contact:  Good  Speech:  Normal Rate  Volume:  Normal  Mood:  Anxious  Affect:  Congruent  Thought Process:  Coherent and Descriptions of Associations: Intact  Orientation:  Full (Time, Place, and Person)  Thought Content:  Rumination  Suicidal Thoughts:  No  Homicidal Thoughts:  No  Memory:  Immediate;   Good Recent;   Good Remote;   Good  Judgement:  Intact  Insight:  Lacking  Psychomotor Activity:  Increased  Concentration:  Concentration: Good and Attention Span: Good  Recall:  Good  Fund of Knowledge:  Good  Language:  Good  Akathisia:  Negative  Handed:  Right  AIMS (if indicated):     Assets:  Desire for Improvement Housing Resilience Social Support  ADL's:  Intact  Cognition:  WNL  Sleep:  Number of Hours: 7.45      COGNITIVE FEATURES THAT CONTRIBUTE TO RISK:  Thought constriction (tunnel vision)    SUICIDE RISK:   Minimal: No identifiable suicidal ideation.  Patients presenting with no risk factors but with morbid ruminations; may be classified as minimal risk based on the severity of the depressive symptoms  PLAN OF CARE: Patient is seen and examined.  Patient is a 39 year old male with the above-stated past psychiatric history who was admitted secondary to anxiety, depression and suicidal thoughts.  He will be admitted to the hospital.  He will be integrated into the milieu.  He will be encouraged to attend groups.  In addition to his anxiety I think he probably has irritable bowel syndrome.  I am going to  start him back on MiraLAX daily, but also add Bentyl.  I will also add Protonix just in case.  From a psychiatric perspective I think we have to approach this differently.  He has not been treated with a mood stabilizer in the past, and I think Neurontin would be a good place to start.  We will start Neurontin 100 mg p.o. 3 times daily.  Hopefully at that dose he will not get any urinary retention.  He has been on mirtazapine in the past.  He did take Seroquel 100 mg p.o. nightly, but that apparently was stopped secondary to side effects.  He most recently has received diazepam 5 mg p.o. twice daily.  I have explained to him my concern that for this severe of an anxiety problem that he may become dependent upon it.  I will write for Ambien 5 mg p.o. nightly for sleep to see if that helps.  I will hold on any SSRIs at least at this point.  We will also continue oral folic acid and thiamine as well as vitamin D.  We will also continue his Flomax.  Review of his admission  laboratories showed electrolytes that were all essentially normal, a CBC that was essentially normal except for mildly elevated platelets at 439,000.  Previously 3 months ago they were at 400,000.  Acetaminophen and salicylate were both negative.  Blood alcohol was less than 10.  Drug screen was negative except for benzodiazepines.  I will go on and write for diazepam 2.5 mg p.o. 3 times daily as needed just to make sure that he does not go into any diazepam withdrawal syndromes. I certify that inpatient services furnished can reasonably be expected to improve the patient's condition.   Antonieta Pert, MD 10/15/2019, 12:12 PM

## 2019-10-15 NOTE — [facility] Group Notes (Signed)
[facility] Group Notes:  (Nursing/MHT/Case Management/Adjunct)  Date:  10/15/2019  Time:  5:31 PM  Type of Therapy:  Community Meeting  Participation Level:  Active  Participation Quality:  Appropriate and Attentive  Affect:  Appropriate  Cognitive:  Appropriate  Insight:  Appropriate  Engagement in Group:  Engaged  Modes of Intervention:  Discussion and Education  Summary of Progress/Problems:  [facility] 10/15/2019, 5:31 PM

## 2019-10-16 DIAGNOSIS — F411 Generalized anxiety disorder: Principal | ICD-10-CM

## 2019-10-16 MED ORDER — QUETIAPINE FUMARATE 100 MG PO TABS
100.0000 mg | ORAL_TABLET | Freq: Every day | ORAL | Status: DC
  Administered 2019-10-16: 100 mg via ORAL
  Filled 2019-10-16: qty 1

## 2019-10-16 MED ORDER — CLONAZEPAM 0.5 MG PO TABS
0.2500 mg | ORAL_TABLET | Freq: Two times a day (BID) | ORAL | Status: DC | PRN
  Administered 2019-10-16 – 2019-10-17 (×2): 0.25 mg via ORAL
  Filled 2019-10-16 (×2): qty 1

## 2019-10-16 NOTE — [facility] Group Notes (Signed)
LCSW Group Therapy Note   10/16/2019 12:28 PM   Type of Therapy and Topic:  Group Therapy:  Overcoming Obstacles   Participation Level:  None   Description of Group:    In this group patients will be encouraged to explore what they see as obstacles to their own wellness and recovery. They will be guided to discuss their thoughts, feelings, and behaviors related to these obstacles. The group will process together ways to cope with barriers, with attention given to specific choices patients can make. Each patient will be challenged to identify changes they are motivated to make in order to overcome their obstacles. This group will be process-oriented, with patients participating in exploration of their own experiences as well as giving and receiving support and challenge from other group members.   Therapeutic Goals: 1. Patient will identify personal and current obstacles as they relate to admission. 2. Patient will identify barriers that currently interfere with their wellness or overcoming obstacles.  3. Patient will identify feelings, thought process and behaviors related to these barriers. 4. Patient will identify two changes they are willing to make to overcome these obstacles:      Summary of Patient Progress Pt was present but was unable to participate due to language barriers.     Therapeutic Modalities:   Cognitive Behavioral Therapy Solution Focused Therapy Motivational Interviewing Relapse Prevention Therapy  Iris Pert, MSW, LCSW Clinical Social Work 10/16/2019 12:28 PM

## 2019-10-16 NOTE — Progress Notes (Signed)
D: Patient has been pleasant, calm and cooperative. Denies SI, HI and AVH.  A: continue to monitor for safety R: Safety maintained.

## 2019-10-16 NOTE — Progress Notes (Signed)
[facility] MD Progress Note   10/16/2019 6:09 PM Douglas Baldwin  MRN:  [id number] Subjective: Patient seen chart reviewed.  This is a 39 year old man who presented to the hospital with anxiety.  Today I interviewed him with the assistance of a hospital provided Spanish language interpreter.  I asked him to review his history for me.  Once again he talks about anxiety having been present since this past summer but being much worse this last month.  He characterizes it just as a general sense of anxiety and does not seem to be able to attach any particular thought to it.  Although it looks like he and his discussion with previous providers he had emphasized medical and somatic complaints he did not specify those as being a major problem talking to me.  He denied having any suicidal thoughts whatsoever.  Denied any psychotic symptoms.  We reviewed the multiple attempts at treating this condition and it really seem like every medication pretty much that had been tried so far had resulted in some kind of side effects often ones that do not really match up with what is typical for that drug.  Today the patient tells me that he is most concerned about his daughter's birthday being tomorrow and how much he would like to be discharged for that. Principal Problem: Anxiety state Diagnosis: Principal Problem:   Anxiety state  Total Time spent with patient: 1 hour  Past Psychiatric History: He has had outpatient treatment through the [place] [facility].  He was seen at the [facility] ER 1 time.  As far as I can tell no previous hospitalizations or suicide attempts  Past Medical History:  Past Medical History:  Diagnosis Date  . Calculus (=stone) 05/02/2013  . Chronic prostatitis     Past Surgical History:  Procedure Laterality Date  . NO PAST SURGERIES     Family History: History reviewed. No pertinent family history. Family Psychiatric  History: Brother with anxiety Social History:  Social History   Substance and  Sexual Activity  Alcohol Use No  . Alcohol/week: 0.0 standard drinks     Social History   Substance and Sexual Activity  Drug Use No    Social History   Socioeconomic History  . Marital status: Married    Spouse name: Not on file  . Number of children: Not on file  . Years of education: Not on file  . Highest education level: Not on file  Occupational History  . Not on file  Tobacco Use  . Smoking status: Never Smoker  . Smokeless tobacco: Never Used  Substance and Sexual Activity  . Alcohol use: No    Alcohol/week: 0.0 standard drinks  . Drug use: No  . Sexual activity: Not on file  Other Topics Concern  . Not on file  Social History Narrative  . Not on file   Social Determinants of Health   Financial Resource Strain:   . Difficulty of Paying Living Expenses: Not on file  Food Insecurity:   . Worried About Charity fundraiser in the Last Year: Not on file  . Ran Out of Food in the Last Year: Not on file  Transportation Needs:   . Lack of Transportation (Medical): Not on file  . Lack of Transportation (Non-Medical): Not on file  Physical Activity:   . Days of Exercise per Week: Not on file  . Minutes of Exercise per Session: Not on file  Stress:   . Feeling of Stress :  Not on file  Social Connections:   . Frequency of Communication with Friends and Family: Not on file  . Frequency of Social Gatherings with Friends and Family: Not on file  . Attends Religious Services: Not on file  . Active Member of Clubs or Organizations: Not on file  . Attends Banker Meetings: Not on file  . Marital Status: Not on file   Additional Social History:                         Sleep: Fair  Appetite:  Fair  Current Medications: Current Facility-Administered Medications  Medication Dose Route Frequency Provider Last Rate Last Admin  . acetaminophen (TYLENOL) tablet 650 mg  650 mg Oral Q6H PRN Dixon, Rashaun M, NP      . alum & mag hydroxide-simeth  (MAALOX/MYLANTA) 200-200-20 MG/5ML suspension 30 mL  30 mL Oral Q4H PRN Dixon, Elray Buba, NP      . clonazePAM (KLONOPIN) tablet 0.25 mg  0.25 mg Oral BID PRN Jeylin Woodmansee T, MD      . magnesium hydroxide (MILK OF MAGNESIA) suspension 30 mL  30 mL Oral Daily PRN Dixon, Rashaun M, NP      . QUEtiapine (SEROQUEL) tablet 100 mg  100 mg Oral QHS Skylin Kennerson T, MD        Lab Results: No results found for this or any previous visit (from the past 48 hour(s)).  Blood Alcohol level:  Lab Results  Component Value Date   ETH <10 10/13/2019    Metabolic Disorder Labs: No results found for: HGBA1C, MPG No results found for: PROLACTIN No results found for: CHOL, TRIG, HDL, CHOLHDL, VLDL, LDLCALC  Physical Findings: AIMS: Facial and Oral Movements Muscles of Facial Expression: None, normal Lips and Perioral Area: None, normal Jaw: None, normal Tongue: None, normal,Extremity Movements Upper (arms, wrists, hands, fingers): None, normal Lower (legs, knees, ankles, toes): None, normal, Trunk Movements Neck, shoulders, hips: None, normal, Overall Severity Severity of abnormal movements (highest score from questions above): None, normal Incapacitation due to abnormal movements: None, normal Patient's awareness of abnormal movements (rate only patient's report): No Awareness, Dental Status Current problems with teeth and/or dentures?: No Does patient usually wear dentures?: No  CIWA:    COWS:     Musculoskeletal: Strength & Muscle Tone: within normal limits Gait & Station: normal Patient leans: N/A  Psychiatric Specialty Exam: Physical Exam  Nursing note and vitals reviewed. Constitutional: He appears well-developed and well-nourished.  HENT:  Head: Normocephalic and atraumatic.  Eyes: Pupils are equal, round, and reactive to light. Conjunctivae are normal.  Cardiovascular: Regular rhythm and normal heart sounds.  Respiratory: Effort normal.  GI: Soft.  Musculoskeletal:         General: Normal range of motion.     Cervical back: Normal range of motion.  Neurological: He is alert.  Skin: Skin is warm and dry.  Psychiatric: His speech is normal and behavior is normal. Judgment and thought content normal. His mood appears anxious. Cognition and memory are normal.    Review of Systems  Constitutional: Negative.   HENT: Negative.   Eyes: Negative.   Respiratory: Negative.   Cardiovascular: Negative.   Gastrointestinal: Negative.   Musculoskeletal: Negative.   Skin: Negative.   Neurological: Negative.   Psychiatric/Behavioral: The patient is nervous/anxious.     Blood pressure 121/75, pulse 72, temperature 97.6 F (36.4 C), temperature source Oral, resp. rate 18, height 5\' 7"  (1.702 m),  weight 71.7 kg, SpO2 100 %.Body mass index is 24.75 kg/m.  General Appearance: Casual  Eye Contact:  Fair  Speech:  Normal Rate  Volume:  Normal  Mood:  Anxious  Affect:  Congruent  Thought Process:  Coherent  Orientation:  Full (Time, Place, and Person)  Thought Content:  Logical and Tangential  Suicidal Thoughts:  No  Homicidal Thoughts:  No  Memory:  Immediate;   Fair Recent;   Fair Remote;   Fair  Judgement:  Fair  Insight:  Fair  Psychomotor Activity:  Normal  Concentration:  Concentration: Fair  Recall:  [employer] of Knowledge:  Fair  Language:  Fair  Akathisia:  No  Handed:  Right  AIMS (if indicated):     Assets:  Desire for Improvement Housing Physical Health Social Support  ADL's:  Intact  Cognition:  WNL  Sleep:  Number of Hours: 7     Treatment Plan Summary: Daily contact with patient to assess and evaluate symptoms and progress in treatment, Medication management and Plan Even after talking with him and reviewing the chart I am not still not entirely certain what the correct diagnosis would be.  It is possible that this is a depression which is presenting in the form of somatic and anxious feelings.  However, he has been tried on more than 1  antidepressant and evidently without any tolerance to it.  He tells me that when he took Seroquel it helped him at night but he still felt nervous during the day.  I reviewed the medicines he was taking as of today and it looked like it was a somewhat confusing combination of somatic treatments for his gut and low doses of medicines like gabapentin.  I suggested to the patient that we simplify things by restarting the Seroquel at night and having as needed clonazepam during the day for anxiety.  I emphasized to him very much that psychotherapy would be crucial for resolving this problem.  We will discuss his problem again tomorrow and if he is feeling stable likely discharge him.  Mordecai Rasmussen, MD 10/16/2019, 6:09 PM

## 2019-10-16 NOTE — [facility] Group Notes (Signed)
[facility] Group Notes:  (Nursing/MHT/Case Management/Adjunct)  Date:  10/16/2019  Time:  5:28 PM  Type of Therapy:  Psychoeducational Skills  Participation Level:  Active  Participation Quality:  Appropriate and Attentive  Affect:  Appropriate  Cognitive:  Alert and Appropriate  Insight:  Appropriate  Engagement in Group:  Engaged  Modes of Intervention:  Activity, Discussion and Education  Summary of Progress/Problems:  Kerrie Pleasure 10/16/2019, 5:28 PM

## 2019-10-16 NOTE — [facility] Suicide Risk Assessment (Signed)
[facility] INPATIENT:  Family/Significant Other Suicide Prevention Education  Suicide Prevention Education:  Contact Attempts: Douglas Baldwin, wife [phone] has been identified by the patient as the family member/significant other with whom the patient will be residing, and identified as the person(s) who will aid the patient in the event of a mental health crisis.  With written consent from the patient, two attempts were made to provide suicide prevention education, prior to and/or following the patient's discharge.  We were unsuccessful in providing suicide prevention education.  A suicide education pamphlet was given to the patient to share with family/significant other.  Date and time of first attempt: 10/16/19 at 11:14am Date and time of second attempt:   CSW left a HIPAA compliant voicemail requesting a call back via interpreter Marijean Niemann [phone].  Charlann Lange Resa Rinks MSW LCSW 10/16/2019, 11:14 AM

## 2019-10-16 NOTE — [facility] Group Notes (Signed)
[facility] Group Notes:  (Nursing/MHT/Case Management/Adjunct)  Date:  10/16/2019  Time:  5:27 PM  Type of Therapy:  Community Meeting  Participation Level:  Active  Participation Quality:  Appropriate and Attentive  Affect:  Appropriate  Cognitive:  Alert and Appropriate  Insight:  Appropriate  Engagement in Group:  Engaged  Modes of Intervention:  Discussion and Education  Summary of Progress/Problems:  Douglas Baldwin 10/16/2019, 5:27 PM

## 2019-10-16 NOTE — Plan of Care (Signed)
Patient stated that his anxiety and depression is getting better.Patient verbalized anxiety about the side effect of medications he is taking like mild nausea.Patient is visible in the milieu,appropriate with staff & peers.Denies SI,HI and AVH.Compliant with medications.Appetite and energy level good.Attended groups.Support and encouragement given.

## 2019-10-16 NOTE — Plan of Care (Signed)
  Problem: Coping: Goal: Coping ability will improve Outcome: Progressing Goal: Will verbalize feelings Outcome: Progressing  D: Patient has been pleasant, calm and cooperative. Denies SI, HI and AVH.  A: continue to monitor for safety R: Safety maintained.

## 2019-10-16 NOTE — [facility] Group Notes (Signed)
[facility] Group Notes:  (Nursing/MHT/Case Management/Adjunct)  Date:  10/16/2019  Time:  10:36 PM  Type of Therapy:  Group Therapy  Participation Level:  Did Not Attend    Douglas Baldwin 10/16/2019, 10:36 PM

## 2019-10-17 MED ORDER — QUETIAPINE FUMARATE 100 MG PO TABS: 100.0000 mg | ORAL_TABLET | Freq: Every day | ORAL | 0 refills | Status: DC

## 2019-10-17 NOTE — Progress Notes (Signed)
D: Patient shaved tonight. Asking questions about his medications. Provided printed information in Spanish about Klonopin and Seroquel. Patient was asking why he was taken off of his old medications. Provided patient with printed health information on anxiety in Spanish. Patient verbalized understanding. Says the pandemic and concerns for his family have increased his anxiety. Denies SI, HI and AVH. A: Continue to monitor for safety R: Safety maintained.

## 2019-10-17 NOTE — [facility] Group Notes (Signed)
LCSW Group Therapy Note  10/17/2019 1:00 PM  Type of Therapy/Topic:  Group Therapy:  Feelings about Diagnosis  Participation Level:  Did Not Attend   Description of Group:   This group will allow patients to explore their thoughts and feelings about diagnoses they have received. Patients will be guided to explore their level of understanding and acceptance of these diagnoses. Facilitator will encourage patients to process their thoughts and feelings about the reactions of others to their diagnosis and will guide patients in identifying ways to discuss their diagnosis with significant others in their lives. This group will be process-oriented, with patients participating in exploration of their own experiences, giving and receiving support, and processing challenge from other group members.   Therapeutic Goals: 1. Patient will demonstrate understanding of diagnosis as evidenced by identifying two or more symptoms of the disorder 2. Patient will be able to express two feelings regarding the diagnosis 3. Patient will demonstrate their ability to communicate their needs through discussion and/or role play  Summary of Patient Progress: X  Therapeutic Modalities:   Cognitive Behavioral Therapy Brief Therapy Feelings Identification   Doratha Mcswain, MSW, LCSW 10/17/2019 12:47 PM  

## 2019-10-17 NOTE — Progress Notes (Addendum)
Information review  With Str[facility] Lafonda Mosses # [id number]  D: Patient is aware of  Discharge this shift .  A:Patient denies suicidal /homicidal ideations. Patient received all belongings brought in  No Storage medications. Writer reviewed Discharge Summary, Suicide Risk Assessment, and Transitional Record. Patient also received Prescriptions   from  MD.  Aware  Of follow up appointment .  R: Patient left unit with no questions  Or concerns  With  Wife

## 2019-10-17 NOTE — Tx Team (Signed)
Interdisciplinary Treatment and Diagnostic Plan Update  10/17/2019 Time of Session: 900am Douglas Baldwin MRN: [id number]  Principal Diagnosis: Anxiety state  Secondary Diagnoses: Principal Problem:   Anxiety state   Current Medications:  Current Facility-Administered Medications  Medication Dose Route Frequency Provider Last Rate Last Admin  . acetaminophen (TYLENOL) tablet 650 mg  650 mg Oral Q6H PRN Dixon, Rashaun M, NP      . alum & mag hydroxide-simeth (MAALOX/MYLANTA) 200-200-20 MG/5ML suspension 30 mL  30 mL Oral Q4H PRN Dixon, Ernst Bowler, NP      . clonazePAM Bobbye Charleston) tablet 0.25 mg  0.25 mg Oral BID PRN Clapacs, Madie Reno, MD   0.25 mg at 10/17/19 0929  . magnesium hydroxide (MILK OF MAGNESIA) suspension 30 mL  30 mL Oral Daily PRN Doren Custard, Rashaun M, NP      . QUEtiapine (SEROQUEL) tablet 100 mg  100 mg Oral QHS Clapacs, Madie Reno, MD   100 mg at 10/16/19 2115   PTA Medications: Medications Prior to Admission  Medication Sig Dispense Refill Last Dose  . [DISCONTINUED] QUEtiapine (SEROQUEL) 100 MG tablet Take 100 mg by mouth at bedtime.   Past Week at Unknown time  . Acetaminophen (TYLENOL PO) Take by mouth as needed.     . diazepam (VALIUM) 5 MG tablet Take 5 mg by mouth 2 (two) times daily as needed.     Marland Kitchen escitalopram (LEXAPRO) 5 MG tablet Take 2.5 mg by mouth daily.     . mirtazapine (REMERON) 15 MG tablet Take 15 mg by mouth at bedtime.     . ondansetron (ZOFRAN) 4 MG tablet Take 1 tablet (4 mg total) by mouth every 8 (eight) hours as needed. (Patient not taking: Reported on 07/12/2019) 20 tablet 0   . polyethylene glycol (MIRALAX) 17 g packet Take 17 g by mouth daily. 30 each 1   . tamsulosin (FLOMAX) 0.4 MG CAPS capsule Take 1 capsule (0.4 mg total) by mouth daily. (Patient not taking: Reported on 07/12/2019) 90 capsule 3   . Vitamin D, Ergocalciferol, (DRISDOL) 1.25 MG (50000 UNIT) CAPS capsule Take 50,000 Units by mouth once a week.       Patient Stressors: Medication  change or noncompliance Occupational concerns  Patient Strengths: Average or above average intelligence Capable of independent living Communication skills  Treatment Modalities: Medication Management, Group therapy, Case management,  1 to 1 session with clinician, Psychoeducation, Recreational therapy.   Physician Treatment Plan for Primary Diagnosis: Anxiety state Long Term Goal(s): Improvement in symptoms so as ready for discharge Improvement in symptoms so as ready for discharge   Short Term Goals: Ability to identify changes in lifestyle to reduce recurrence of condition will improve Ability to verbalize feelings will improve Ability to disclose and discuss suicidal ideas Ability to demonstrate self-control will improve Ability to identify and develop effective coping behaviors will improve Ability to maintain clinical measurements within normal limits will improve Compliance with prescribed medications will improve Ability to identify changes in lifestyle to reduce recurrence of condition will improve Ability to verbalize feelings will improve Ability to disclose and discuss suicidal ideas Ability to demonstrate self-control will improve Ability to identify and develop effective coping behaviors will improve Ability to maintain clinical measurements within normal limits will improve Compliance with prescribed medications will improve  Medication Management: Evaluate patient's response, side effects, and tolerance of medication regimen.  Therapeutic Interventions: 1 to 1 sessions, Unit Group sessions and Medication administration.  Evaluation of Outcomes: Adequate for Discharge  Physician  Treatment Plan for Secondary Diagnosis: Principal Problem:   Anxiety state  Long Term Goal(s): Improvement in symptoms so as ready for discharge Improvement in symptoms so as ready for discharge   Short Term Goals: Ability to identify changes in lifestyle to reduce recurrence of  condition will improve Ability to verbalize feelings will improve Ability to disclose and discuss suicidal ideas Ability to demonstrate self-control will improve Ability to identify and develop effective coping behaviors will improve Ability to maintain clinical measurements within normal limits will improve Compliance with prescribed medications will improve Ability to identify changes in lifestyle to reduce recurrence of condition will improve Ability to verbalize feelings will improve Ability to disclose and discuss suicidal ideas Ability to demonstrate self-control will improve Ability to identify and develop effective coping behaviors will improve Ability to maintain clinical measurements within normal limits will improve Compliance with prescribed medications will improve     Medication Management: Evaluate patient's response, side effects, and tolerance of medication regimen.  Therapeutic Interventions: 1 to 1 sessions, Unit Group sessions and Medication administration.  Evaluation of Outcomes: Adequate for Discharge   RN Treatment Plan for Primary Diagnosis: Anxiety state Long Term Goal(s): Knowledge of disease and therapeutic regimen to maintain health will improve  Short Term Goals: Ability to participate in decision making will improve, Ability to verbalize feelings will improve and Compliance with prescribed medications will improve  Medication Management: RN will administer medications as ordered by provider, will assess and evaluate patient's response and provide education to patient for prescribed medication. RN will report any adverse and/or side effects to prescribing provider.  Therapeutic Interventions: 1 on 1 counseling sessions, Psychoeducation, Medication administration, Evaluate responses to treatment, Monitor vital signs and CBGs as ordered, Perform/monitor CIWA, COWS, AIMS and Fall Risk screenings as ordered, Perform wound care treatments as  ordered.  Evaluation of Outcomes: Adequate for Discharge   LCSW Treatment Plan for Primary Diagnosis: Anxiety state Long Term Goal(s): Safe transition to appropriate next level of care at discharge, Engage patient in therapeutic group addressing interpersonal concerns.  Short Term Goals: Engage patient in aftercare planning with referrals and resources and Increase skills for wellness and recovery  Therapeutic Interventions: Assess for all discharge needs, 1 to 1 time with Social worker, Explore available resources and support systems, Assess for adequacy in community support network, Educate family and significant other(s) on suicide prevention, Complete Psychosocial Assessment, Interpersonal group therapy.  Evaluation of Outcomes: Adequate for Discharge   Progress in Treatment: Attending groups: Yes. Participating in groups: No. Taking medication as prescribed: Yes. Toleration medication: Yes. Family/Significant other contact made: No, will contact:  attempts to contact pts wife Patient understands diagnosis: No. Discussing patient identified problems/goals with staff: Yes. Medical problems stabilized or resolved: Yes. Denies suicidal/homicidal ideation: Yes. Issues/concerns per patient self-inventory: No. Other: N/A  New problem(s) identified: No, Describe:  none  New Short Term/Long Term Goal(s): Detox, elimination of AVH/symptoms of psychosis, medication management for mood stabilization; elimination of SI thoughts; development of comprehensive mental wellness/sobriety plan.   Patient Goals:  "To feel less anxious and go home"  Discharge Plan or Barriers: SPE pamphlet, Mobile Crisis information, and AA/NA information provided to patient for additional community support and resources. Pt has an appointment with [facility].  Reason for Continuation of Hospitalization: none  Estimated Length of Stay: Today 10/17/19  Attendees: Patient: Douglas Baldwin 10/17/2019 1:50 PM   Physician: Dr Toni Amend MD 10/17/2019 1:50 PM  Nursing: Doyce Para, RN 10/17/2019 1:50 PM  RN Care Manager:  10/17/2019 1:50 PM  Social Worker: Zollie Scale Amarra Sawyer LCSW 10/17/2019 1:50 PM  Recreational Therapist: Danella Deis Outlaw CTRS LRT 10/17/2019 1:50 PM  Other:  10/17/2019 1:50 PM  Other:  10/17/2019 1:50 PM  Other: 10/17/2019 1:50 PM    Scribe for Treatment Team: Charlann Lange Vida Nicol, LCSW 10/17/2019 1:50 PM

## 2019-10-17 NOTE — Progress Notes (Signed)
Recreation Therapy Notes   Date: 10/17/2019  Time: 9:30 am   Location: Craft room   Behavioral response: N/A   Intervention Topic: Goals   Discussion/Intervention: Patient did not attend group.   Clinical Observations/Feedback:  Patient did not attend group.   Brenna Friesenhahn LRT/CTRS        Larrell Rapozo 10/17/2019 12:34 PM

## 2019-10-17 NOTE — Discharge Summary (Signed)
Physician Discharge Summary Note  Patient:  Douglas Baldwin is an 39 y.o., male MRN:  [id number] DOB:  11/17/1980 Patient phone:  [phone] (home)  Patient address:   [address] [place] [address],  Total Time spent with patient: 45 minutes  Date of Admission:  10/14/2019 Date of Discharge: 10/17/2019  Reason for Admission:  Patient is seen and examined. Patient is a 39 year old Hispanic male admitted on 10/14/2019 secondary to continued problems with anxiety and suicidal ideation. The patient was seen with an interpreter today. The patient stated that he had been anxious since the COVID-19 pandemic. He stated that he had had multiple somatic symptoms including abdominal pain, dizziness, problems with urination. He had been seen at the [facility], and had had multiple tests done. This included an MRI which was essentially normal. He was treated for prostatitis and given antibiotics and had some degree of improvement. He also saw someone for physical therapy for what sounds like Kegel maneuvers to control urination. He has been seen at an outpatient [facility] as [facility] emergency room on several occasions. Review of the electronic medical record as well as the patient shows that he has been treated with sertraline, buspirone, Seroquel, diazepam and multiple other medications without success. He stated that if he does not get these things under control he may harm himself. He did receive diazepam on 2/23 sixtytablets, but has not had relief from the symptoms. Previously he had also received clonazepam on 07/11/2019. He does have some form of a vitamin B 12 deficiency and has received cyanocobalamin injections. His last injection was on 09/21/2019. Besides the diazepam his most recent medications included mirtazapine on 09/11/2019. In that note it also stated that he had been treated with venlafaxine as well as sertraline. He had side  effects to both of those. As stated above he had an MRI without contrast in January secondary to dizziness. At that time he was also taking MiraLAX and Flomax. He had also been given as needed Zofran. Review of the electronic medical record also revealed that he had been previously treated with Seroquel as well as trazodone and Lexapro. He denied any history of sexual trauma. Through our discussion today it sounds as though that he has been anxious for many years. He stated that he has a brother with anxiety as well as alcohol problems. He denied any drug use or alcohol use. Nursing notes reflect that he slept 7-1/2 hours last night, but the patient does not believe that he slept well. He denied any auditory or visual hallucinations. He denied any flashbacks or nightmares. Most of his complaints of side effects have to do with GI, prostate issues or constipation. He was admitted to the hospital for evaluation and stabilization.  Associated Signs/Symptoms: Depression Symptoms:  depressed mood, anhedonia, insomnia, psychomotor agitation, fatigue, difficulty concentrating, hopelessness, suicidal thoughts without plan, anxiety, loss of energy/fatigue, disturbed sleep, (Hypo) Manic Symptoms:  Impulsivity, Anxiety Symptoms:  Excessive Worry, Obsessive Compulsive Symptoms:   Somatic concerns, Psychotic Symptoms:  Denied PTSD Symptoms: Negative   Past Psychiatric History: Patient stated he has been seen at  [facility] clinic as well as the [facility] emergency department for psychiatric reasons.  No previous psychiatric admissions.  Multiple psychiatric medications used so far.  Principal Problem: Anxiety state Discharge Diagnoses: Principal Problem:   Anxiety state   Past Medical History:  Past Medical History:  Diagnosis Date  . Calculus (=stone) 05/02/2013  . Chronic prostatitis  Past Surgical History:  Procedure Laterality Date  . NO PAST SURGERIES     Family History:  History reviewed. No pertinent family history. Family Psychiatric  History: Patient reported brother had anxiety as well as alcohol problems.  Social History:  Social History   Substance and Sexual Activity  Alcohol Use No  . Alcohol/week: 0.0 standard drinks     Social History   Substance and Sexual Activity  Drug Use No    Social History   Socioeconomic History  . Marital status: Married    Spouse name: Not on file  . Number of children: Not on file  . Years of education: Not on file  . Highest education level: Not on file  Occupational History  . Not on file  Tobacco Use  . Smoking status: Never Smoker  . Smokeless tobacco: Never Used  Substance and Sexual Activity  . Alcohol use: No    Alcohol/week: 0.0 standard drinks  . Drug use: No  . Sexual activity: Not on file  Other Topics Concern  . Not on file  Social History Narrative  . Not on file   Social Determinants of Health   Financial Resource Strain:   . Difficulty of Paying Living Expenses: Not on file  Food Insecurity:   . Worried About Programme researcher, broadcasting/film/video in the Last Year: Not on file  . Ran Out of Food in the Last Year: Not on file  Transportation Needs:   . Lack of Transportation (Medical): Not on file  . Lack of Transportation (Non-Medical): Not on file  Physical Activity:   . Days of Exercise per Week: Not on file  . Minutes of Exercise per Session: Not on file  Stress:   . Feeling of Stress : Not on file  Social Connections:   . Frequency of Communication with Friends and Family: Not on file  . Frequency of Social Gatherings with Friends and Family: Not on file  . Attends Religious Services: Not on file  . Active Member of Clubs or Organizations: Not on file  . Attends Banker Meetings: Not on file  . Marital Status: Not on file    Hospital Course:  Douglas Baldwin was admitted for Anxiety state and crisis management.  He was treated with the following medications Quetiapine  100mg  po daily for mood,a gitation and Klonopin 0.25mg  po BID prn for anxiety. was discharged with current medication and was instructed on how to take medications as prescribed; (details listed below under Medication List).  Medical problems were identified and treated as needed.  Home medications were restarted as appropriate.  Improvement was monitored by observation and Douglas Baldwin daily report of symptom reduction.  Emotional and mental status was monitored by daily self-inventory reports completed by Douglas Baldwin and clinical staff.         Douglas Baldwin was evaluated by the treatment team for stability and plans for continued recovery upon discharge.  Douglas Baldwin motivation was an integral factor for scheduling further treatment.  Employment, transportation, bed availability, health status, family support, and any pending legal issues were also considered during his hospital stay.  He was offered further treatment options upon discharge including but not limited to Residential, Intensive Outpatient, and Outpatient treatment.  Douglas Baldwin will follow up with the services as listed below under Follow Up Information.     Upon completion of this admission the Douglas Baldwin was both mentally  and medically stable for discharge denying suicidal/homicidal ideation, auditory/visual/tactile hallucinations, delusional thoughts and paranoia.      Physical Findings: AIMS: Facial and Oral Movements Muscles of Facial Expression: None, normal Lips and Perioral Area: None, normal Jaw: None, normal Tongue: None, normal,Extremity Movements Upper (arms, wrists, hands, fingers): None, normal Lower (legs, knees, ankles, toes): None, normal, Trunk Movements Neck, shoulders, hips: None, normal, Overall Severity Severity of abnormal movements (highest score from questions above): None, normal Incapacitation due to abnormal movements: None,  normal Patient's awareness of abnormal movements (rate only patient's report): No Awareness, Dental Status Current problems with teeth and/or dentures?: No Does patient usually wear dentures?: No  CIWA:    COWS:     Musculoskeletal: Strength & Muscle Tone: within normal limits Gait & Station: normal Patient leans: N/A  Psychiatric Specialty Exam: Physical Exam  Review of Systems  Blood pressure 134/83, pulse (!) 101, temperature 97.8 F (36.6 C), temperature source Oral, resp. rate 17, height 5\' 7"  (1.702 m), weight 71.7 kg, SpO2 100 %.Body mass index is 24.75 kg/m.  Sleep:  Number of Hours: 8.25     Have you used any form of tobacco in the last 30 days? (Cigarettes, Smokeless Tobacco, Cigars, and/or Pipes): No  Has this patient used any form of tobacco in the last 30 days? (Cigarettes, Smokeless Tobacco, Cigars, and/or Pipes)  No  Blood Alcohol level:  Lab Results  Component Value Date   ETH <10 28/31/5176    Metabolic Disorder Labs:  No results found for: HGBA1C, MPG No results found for: PROLACTIN No results found for: CHOL, TRIG, HDL, CHOLHDL, VLDL, LDLCALC  See Psychiatric Specialty Exam and Suicide Risk Assessment completed by Attending Physician prior to discharge.  Discharge destination:  Home  Is patient on multiple antipsychotic therapies at discharge:  No   Has Patient had three or more failed trials of antipsychotic monotherapy by history:  No  Recommended Plan for Multiple Antipsychotic Therapies: NA  Discharge Instructions    Discharge instructions   Complete by: As directed    Please continue to take medications as directed. If your symptoms return, worsen, or persist please call your 911, report to local ER, or contact crisis hotline. Please do not drink alcohol or use any illegal substances while taking prescription medications.     Allergies as of 10/17/2019   No Known Allergies     Medication List    STOP taking these medications   diazepam  5 MG tablet Commonly known as: VALIUM   escitalopram 5 MG tablet Commonly known as: LEXAPRO   mirtazapine 15 MG tablet Commonly known as: REMERON   ondansetron 4 MG tablet Commonly known as: Zofran   polyethylene glycol 17 g packet Commonly known as: MiraLax   tamsulosin 0.4 MG Caps capsule Commonly known as: FLOMAX   TYLENOL PO   Vitamin D (Ergocalciferol) 1.25 MG (50000 UNIT) Caps capsule Commonly known as: DRISDOL     TAKE these medications     Indication  QUEtiapine 100 MG tablet Commonly known as: SEROQUEL Take 100 mg by mouth at bedtime.  Indication: Agitation      Follow-up [facility] Follow up on 10/17/2019.   Specialty: Behavioral Health Why: Appointment with therapist is 10/17/2019 at 1:45PM.  Appointment with psychiatrist is 10/30/2019 at 2:30PM.  Thanks! Contact information: [place] [address] [phone]           Follow-up recommendations:  Activity:  Increase activity as  tolerated Diet:  Routine diet as directed Tests:  Routine testing asdirected Other:  Even if you begin to feel better. Continue taking you rmedications as needed to help with mood and anxiety.   Comments:    Signed: Maryagnes Amos, FNP 10/17/2019, 11:32 AM

## 2019-10-17 NOTE — BHH Suicide Risk Assessment (Signed)
[facility] Discharge Suicide Risk Assessment   Principal Problem: Anxiety state Discharge Diagnoses: Principal Problem:   Anxiety state   Total Time spent with patient: 30 minutes  Musculoskeletal: Strength & Muscle Tone: within normal limits Gait & Station: normal Patient leans: N/A  Psychiatric Specialty Exam: Review of Systems  Constitutional: Negative.   HENT: Negative.   Eyes: Negative.   Respiratory: Negative.   Cardiovascular: Negative.   Gastrointestinal: Positive for abdominal pain.  Musculoskeletal: Negative.   Skin: Negative.   Neurological: Negative.   Psychiatric/Behavioral: Positive for confusion. Negative for dysphoric mood, hallucinations, self-injury, sleep disturbance and suicidal ideas. The patient is nervous/anxious. The patient is not hyperactive.     Blood pressure 134/83, pulse (!) 101, temperature 97.8 F (36.6 C), temperature source Oral, resp. rate 17, height 5\' 7"  (1.702 m), weight 71.7 kg, SpO2 100 %.Body mass index is 24.75 kg/m.  General Appearance: Casual  Eye Contact::  Fair  Speech:  Clear and Coherent409  Volume:  Normal  Mood:  Anxious  Affect:  Congruent  Thought Process:  Coherent  Orientation:  Full (Time, Place, and Person)  Thought Content:  Logical  Suicidal Thoughts:  No  Homicidal Thoughts:  No  Memory:  Immediate;   Fair Recent;   Fair Remote;   Fair  Judgement:  Fair  Insight:  Fair  Psychomotor Activity:  Decreased  Concentration:  Fair  Recall:  [phone] of Knowledge:Fair  Language: Fair  Akathisia:  No  Handed:  Right  AIMS (if indicated):     Assets:  Desire for Improvement Housing Physical Health Resilience Social Support  Sleep:  Number of Hours: 8.25  Cognition: WNL  ADL's:  Intact   Mental Status Per Nursing Assessment::   On Admission:  NA  Demographic Factors:  Male  Loss Factors: NA  Historical Factors: Impulsivity  Risk Reduction Factors:   Responsible for children under 41 years of age,  Sense of responsibility to family, Religious beliefs about death, Employed, Living with another person, especially a relative, Positive social support and Positive therapeutic relationship  Continued Clinical Symptoms:  Severe Anxiety and/or Agitation  Cognitive Features That Contribute To Risk:  None    Suicide Risk:  Minimal: No identifiable suicidal ideation.  Patients presenting with no risk factors but with morbid ruminations; may be classified as minimal risk based on the severity of the depressive symptoms  Follow-up Information    [facility] Follow up on 10/17/2019.   Specialty: Behavioral Health Why: Appointment with therapist is 10/17/2019 at 1:45PM.  Appointment with psychiatrist is 10/30/2019 at 2:30PM.  Thanks! Contact information: [address] [place] [phone]           Plan Of Care/Follow-up recommendations:  Activity:  Activity as tolerated Diet:  Regular diet Other:  Follow-up with [facility]  [phone], MD 10/17/2019, 12:03 PM

## 2019-10-17 NOTE — Progress Notes (Signed)
  [facility] Adult Case Management Discharge Plan :  Will you be returning to the same living situation after discharge:  Yes,  pt reports that he is returning home.  At discharge, do you have transportation home?: Yes,  pt reports family will provide transportation.  Do you have the ability to pay for your medications: Yes,  [employer]  Release of information consent forms completed and in the chart;  Patient's signature needed at discharge.  Patient to Follow up at: Follow-up Information    [facility] Follow up on 10/17/2019.   Specialty: Behavioral Health Why: Appointment with therapist is 10/17/2019 at 1:45PM.  Appointment with psychiatrist is 10/30/2019 at 2:30PM.  Thanks! Contact information: [address] [place] [address] [phone]           Next level of care provider has access to [facility] Link:no  Safety Planning and Suicide Prevention discussed: Yes,  SPE completed with the pt, SPE attempts with the family were unsuccessful.   Have you used any form of tobacco in the last 30 days? (Cigarettes, Smokeless Tobacco, Cigars, and/or Pipes): No  Has patient been referred to the Quitline?: N/A patient is not a smoker  Patient has been referred for addiction treatment: Pt. refused referral  Harden Mo, LCSW 10/17/2019, 10:15 AM

## 2019-10-17 NOTE — Plan of Care (Signed)
  Problem: Coping: Goal: Coping ability will improve Outcome: Progressing Goal: Will verbalize feelings Outcome: Progressing  D: Patient shaved tonight. Asking questions about his medications. Provided printed information in Spanish about Klonopin and Seroquel. Patient was asking why he was taken off of his old medications. Provided patient with printed health information on anxiety in Spanish. Patient verbalized understanding. Says the pandemic and concerns for his family have increased his anxiety. Denies SI, HI and AVH. A: Continue to monitor for safety R: Safety maintained.

## 2019-10-17 NOTE — BHH Suicide Risk Assessment (Signed)
B[facility] INPATIENT:  Family/Significant Other Suicide Prevention Education  Suicide Prevention Education:  Contact Attempts: Douglas Baldwin, wife [phone] has been identified by the patient as the family member/significant other with whom the patient will be residing, and identified as the person(s) who will aid the patient in the event of a mental health crisis.  With written consent from the patient, two attempts were made to provide suicide prevention education, prior to and/or following the patient's discharge.  We were unsuccessful in providing suicide prevention education.  A suicide education pamphlet was given to the patient to share with family/significant other.  Date and time of first attempt:10/16/19 at 11:14am Date and time of second attempt:10/17/2019 at 10:09AM  CSW left HIPAA compliant voicemail requesting a return call back via interpreter [place] [phone].  Harden Mo 10/17/2019, 10:08 AM

## 2019-10-17 NOTE — BHH Group Notes (Signed)
[facility] Group Notes:  (Nursing/MHT/Case Management/Adjunct)  Date:  10/17/2019  Time: 8:30AM Type of Therapy:  Community Meeting  Participation Level:  Did Not Attend  Summary of Progress/Problems: Does not speak Doralee Albino 10/17/2019, 9:34 AM

## 2019-10-17 NOTE — Plan of Care (Signed)
  Problem: Coping: Goal: Coping ability will improve Outcome: Adequate for Discharge Goal: Will verbalize feelings Outcome: Adequate for Discharge   Problem: Safety: Goal: Ability to disclose and discuss suicidal ideas will improve Outcome: Adequate for Discharge Goal: Ability to identify and utilize support systems that promote safety will improve Outcome: Adequate for Discharge   Problem: Health Behavior/Discharge Planning: Goal: Identification of resources available to assist in meeting health care needs will improve Outcome: Adequate for Discharge Goal: Compliance with treatment plan for underlying cause of condition will improve Outcome: Adequate for Discharge

## 2019-10-21 LAB — VITAMIN B1: Vitamin B1 (Thiamine): 148.2 nmol/L (ref 66.5–200.0)

## 2019-10-27 ENCOUNTER — Ambulatory Visit: Payer: 59 | Attending: Internal Medicine

## 2019-10-27 DIAGNOSIS — Z23 Encounter for immunization: Secondary | ICD-10-CM

## 2019-10-27 NOTE — Progress Notes (Signed)
   Covid-19 Vaccination Clinic  Name:  Douglas Baldwin    MRN: [id number] DOB: 03-Sep-1980  10/27/2019  Mr. Douglas Baldwin was observed post Covid-19 immunization for 15 minutes without incident. He was provided with Vaccine Information Sheet and instruction to access the V-Safe system.   Mr. Douglas Baldwin was instructed to call 911 with any severe reactions post vaccine: Marland Kitchen Difficulty breathing  . Swelling of face and throat  . A fast heartbeat  . A bad rash all over body  . Dizziness and weakness   Immunizations Administered    Name Date Dose VIS Date Route   Pfizer COVID-19 Vaccine 10/27/2019  5:57 PM 0.3 mL 07/21/2019 Intramuscular   Manufacturer: [employer], [employer]   Lot: [id number]   NDC: [address]-3

## 2019-11-17 ENCOUNTER — Ambulatory Visit: Payer: 59 | Attending: Internal Medicine

## 2019-11-17 DIAGNOSIS — Z23 Encounter for immunization: Secondary | ICD-10-CM

## 2019-11-17 NOTE — Progress Notes (Signed)
   Covid-19 Vaccination Clinic  Name:  Douglas Baldwin    MRN: [id number] DOB: Sep 18, 1980  11/17/2019  Mr. Douglas Baldwin was observed post Covid-19 immunization for 15 minutes without incident. He was provided with Vaccine Information Sheet and instruction to access the V-Safe system. Medical Interpreter used.  Mr. Douglas Baldwin was instructed to call 911 with any severe reactions post vaccine: Marland Kitchen Difficulty breathing  . Swelling of face and throat  . A fast heartbeat  . A bad rash all over body  . Dizziness and weakness   Immunizations Administered    Name Date Dose VIS Date Route   Pfizer COVID-19 Vaccine 11/17/2019  6:08 PM 0.3 mL 07/21/2019 Intramuscular   Manufacturer: [employer], [employer]   Lot: [id number]   NDC: [address]-7

## 2020-01-16 IMAGING — CT CT HEAD W/O CM
3 of 4 series · 14 of 47 positions shown, 16 images · non-contrast
Comparison: No prior brain imaging.  Recent CT Abdomen and Pelvis

CLINICAL DATA: 38-year-old male with dizziness and nausea. Earlier
CT Abdomen and Pelvis tonight.

EXAM:
CT HEAD WITHOUT CONTRAST
TECHNIQUE: Contiguous axial images were obtained from the base of the skull
through the vertex without intravenous contrast.

[Series 4: ax head wo · axial · 0.33mm/px · z∈[-118,+1]mm · 8 of 30 slices shown, 10 images]
[im 3/30  brain]
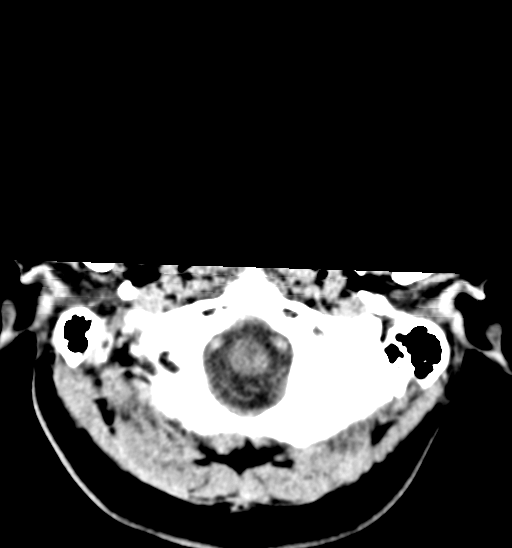
[im 3/30  bone]
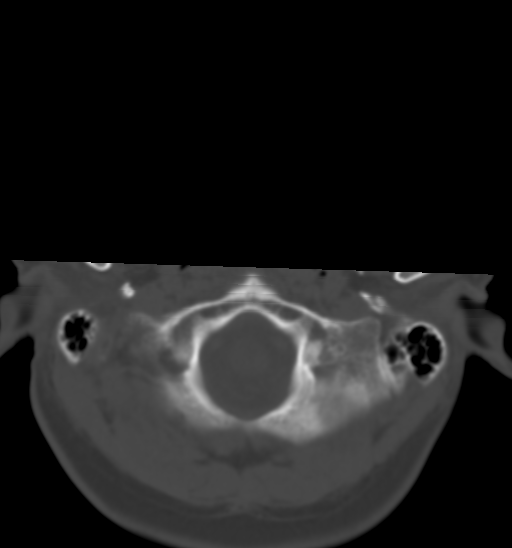
[im 7/30  brain]
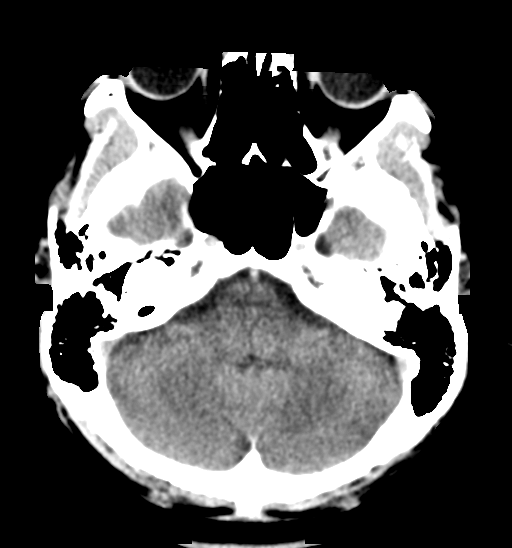
[im 11/30  brain]
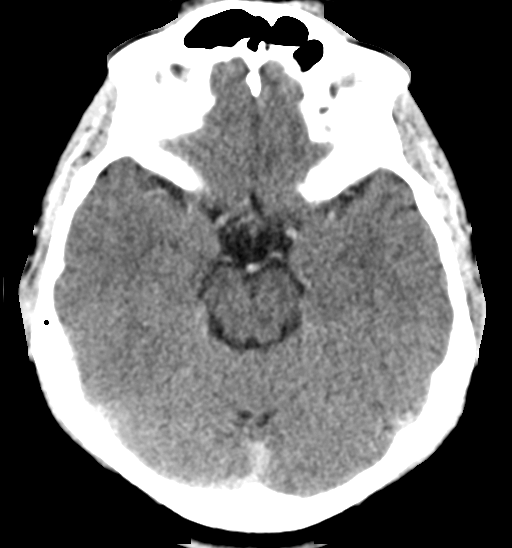
[im 13/30  brain]
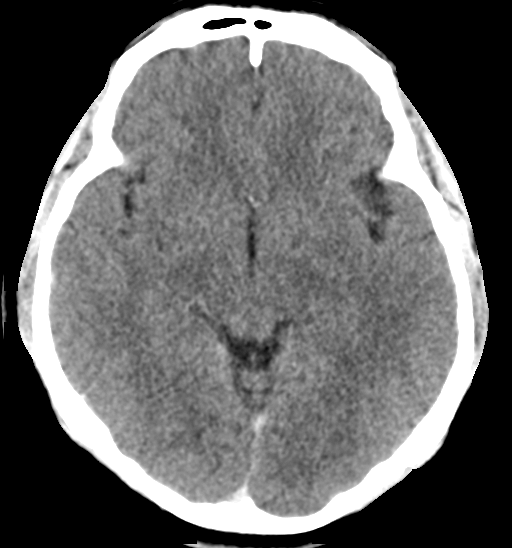
[im 17/30  brain]
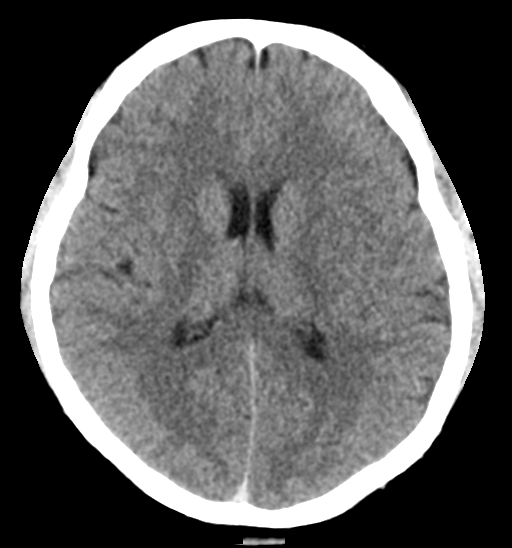
[im 17/30  bone]
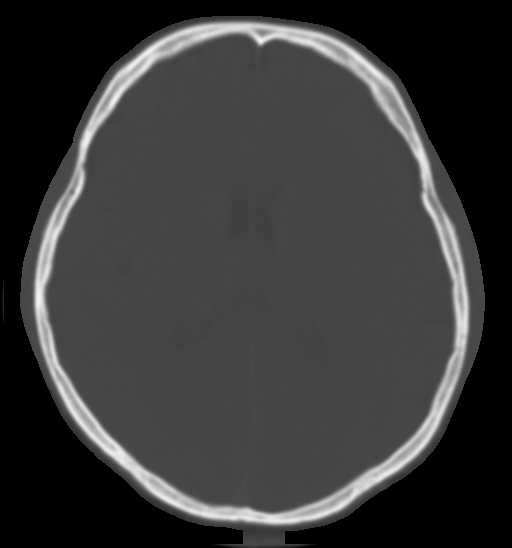
[im 19/30  brain]
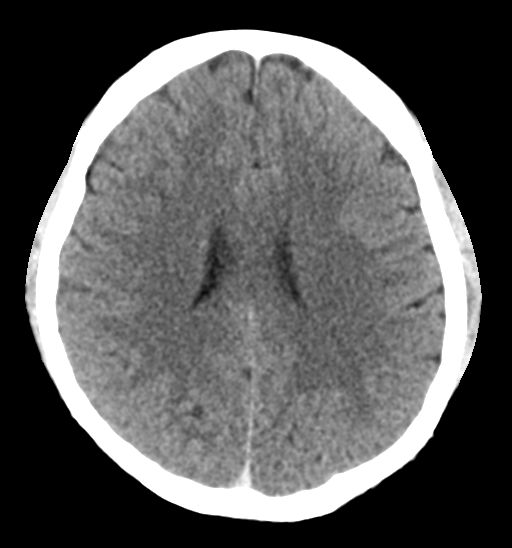
[im 23/30  brain]
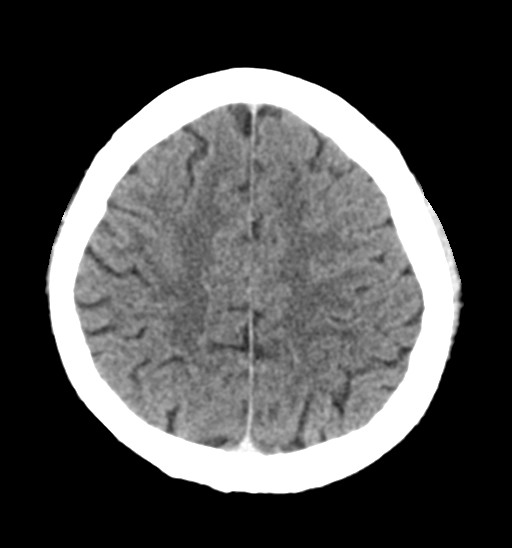
[im 27/30  brain]
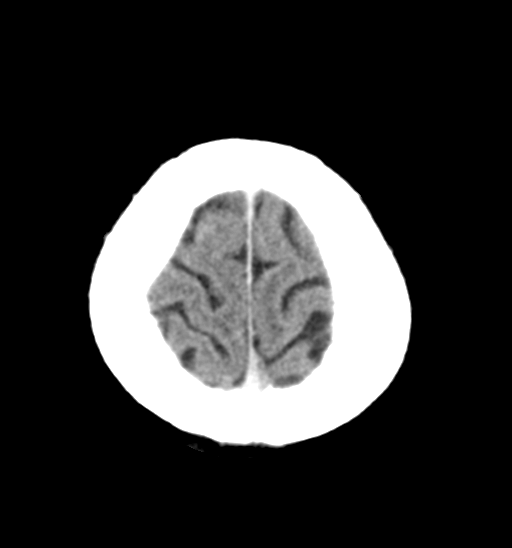

[Series 5: coronal soft tissue · coronal · 0.29mm/px · 3 of 60 slices shown]
[im 20/60  brain]
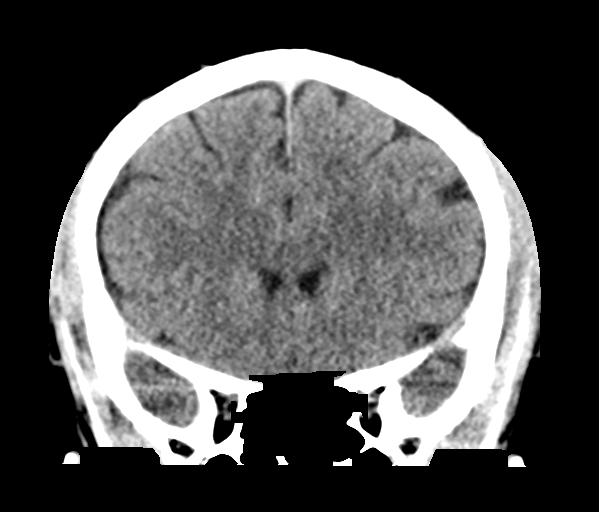
[im 27/60  brain]
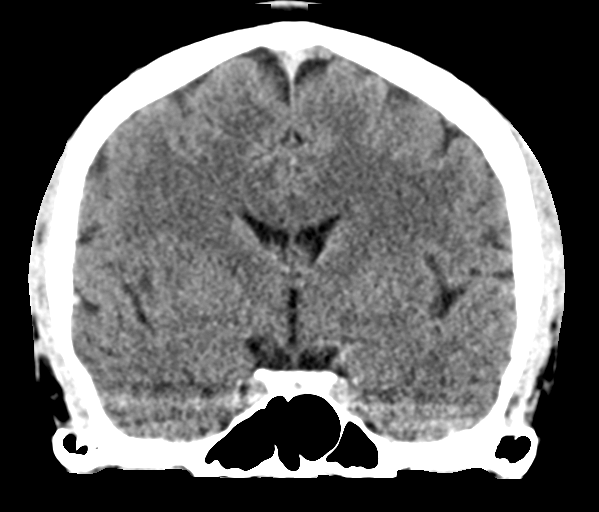
[im 33/60  brain]
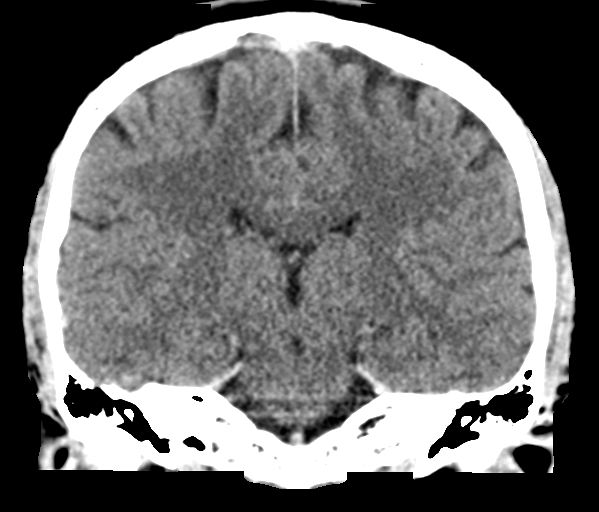

[Series 6: sagittal soft tissue · sagittal · 0.29mm/px · 3 of 52 slices shown]
[im 18/52  brain]
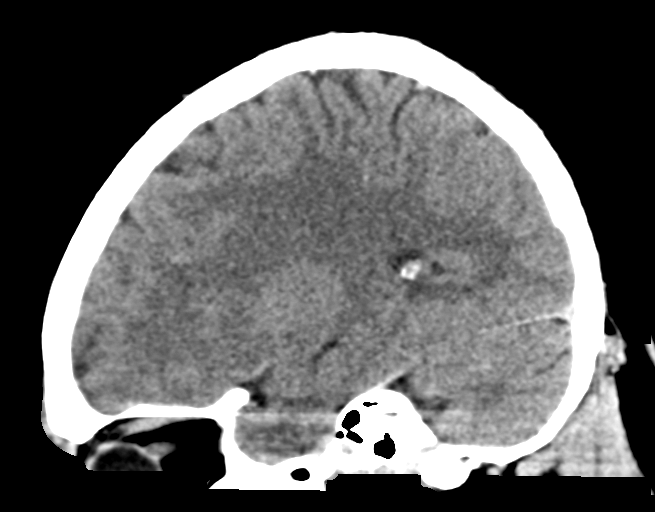
[im 26/52  brain]
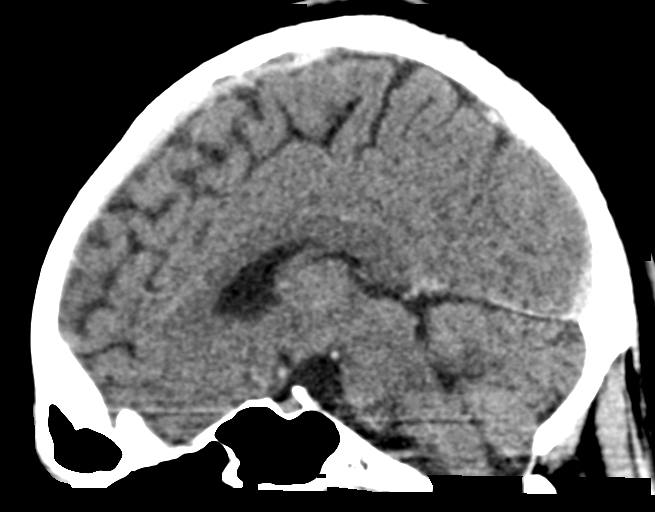
[im 35/52  brain]
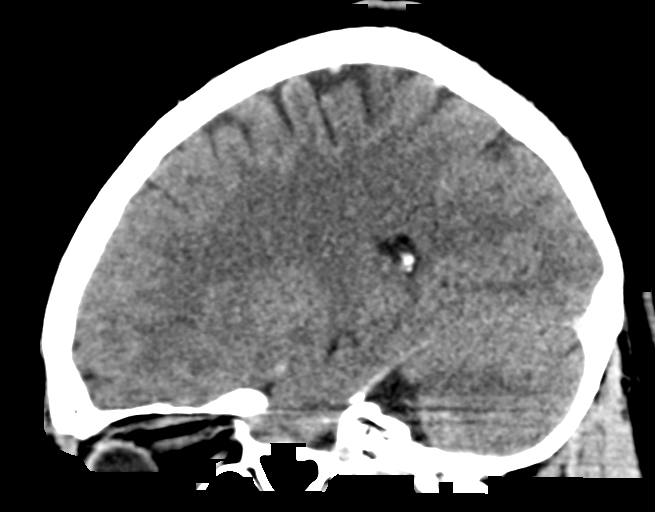

[14 of 47 positions shown; findings below may reference images not displayed]

FINDINGS: Brain: No midline shift, ventriculomegaly, mass effect, evidence of
mass lesion, intracranial hemorrhage or evidence of cortically based
acute infarction. Gray-white matter differentiation is within normal
limits throughout the brain.

Vascular: There is a degree of intravascular contrast from the
earlier CT. No vascular abnormality is evident. No abnormal
enhancement is evident.

Skull: Negative.

Sinuses/Orbits: Visualized paranasal sinuses and mastoids are clear.

Other: Visualized orbits and scalp soft tissues are within normal
limits.
IMPRESSION: Normal CT appearance of the brain. Residual intravascular contrast
from the earlier CT Abdomen and Pelvis.

## 2020-05-20 ENCOUNTER — Telehealth: Payer: Self-pay | Admitting: Urology

## 2020-05-20 NOTE — Telephone Encounter (Signed)
Please have patient schedule a follow up appointment for micro heme.

## 2020-05-23 NOTE — Telephone Encounter (Signed)
Unable to reach patient by phone. No voicemail has been set up.

## 2020-07-09 ENCOUNTER — Encounter: Payer: Self-pay | Admitting: Urology

## 2020-08-27 ENCOUNTER — Encounter: Payer: Self-pay | Admitting: Urology

## 2022-01-08 ENCOUNTER — Other Ambulatory Visit: Payer: Self-pay

## 2022-01-08 ENCOUNTER — Emergency Department: Payer: BC Managed Care – PPO

## 2022-01-08 ENCOUNTER — Emergency Department
Admission: EM | Admit: 2022-01-08 | Discharge: 2022-01-08 | Disposition: A | Discharge status: Home / Self Care | Payer: BC Managed Care – PPO | Attending: Emergency Medicine | Admitting: Emergency Medicine

## 2022-01-08 DIAGNOSIS — N200 Calculus of kidney: Secondary | ICD-10-CM

## 2022-01-08 DIAGNOSIS — N132 Hydronephrosis with renal and ureteral calculous obstruction: Secondary | ICD-10-CM | POA: Diagnosis not present

## 2022-01-08 DIAGNOSIS — R109 Unspecified abdominal pain: Secondary | ICD-10-CM | POA: Diagnosis present

## 2022-01-08 LAB — COMPREHENSIVE METABOLIC PANEL
ALT: 26 U/L (ref 0–44)
AST: 33 U/L (ref 15–41)
Albumin: 4.7 g/dL (ref 3.5–5.0)
Alkaline Phosphatase: 78 U/L (ref 38–126)
Anion gap: 11 (ref 5–15)
BUN: 14 mg/dL (ref 6–20)
CO2: 22 mmol/L (ref 22–32)
Calcium: 9.6 mg/dL (ref 8.9–10.3)
Chloride: 106 mmol/L (ref 98–111)
Creatinine, Ser: 1.18 mg/dL (ref 0.61–1.24)
GFR, Estimated: >60 mL/min (ref >60)
Glucose, Bld: 131 mg/dL — ABNORMAL HIGH (ref 70–99)
Potassium: 3.3 mmol/L — ABNORMAL LOW (ref 3.5–5.1)
Sodium: 139 mmol/L (ref 135–145)
Total Bilirubin: 0.7 mg/dL (ref 0.3–1.2)
Total Protein: 8.2 g/dL — ABNORMAL HIGH (ref 6.5–8.1)

## 2022-01-08 LAB — CBC WITH DIFFERENTIAL/PLATELET
Abs Immature Granulocytes: 0.05 10*3/uL (ref 0.00–0.07)
Basophils Absolute: 0.1 10*3/uL (ref 0.0–0.1)
Basophils Relative: 1 %
Eosinophils Absolute: 0 10*3/uL (ref 0.0–0.5)
Eosinophils Relative: 0 %
HCT: 43.6 % (ref 39.0–52.0)
Hemoglobin: 15.1 g/dL (ref 13.0–17.0)
Immature Granulocytes: 0 %
Lymphocytes Relative: 17 %
Lymphs Abs: 2.6 10*3/uL (ref 0.7–4.0)
MCH: 30.4 pg (ref 26.0–34.0)
MCHC: 34.6 g/dL (ref 30.0–36.0)
MCV: 87.9 fL (ref 80.0–100.0)
Monocytes Absolute: 0.8 10*3/uL (ref 0.1–1.0)
Monocytes Relative: 5 %
Neutro Abs: 12 10*3/uL — ABNORMAL HIGH (ref 1.7–7.7)
Neutrophils Relative %: 77 %
Platelets: 437 10*3/uL — ABNORMAL HIGH (ref 150–400)
RBC: 4.96 MIL/uL (ref 4.22–5.81)
RDW: 12 % (ref 11.5–15.5)
WBC: 15.5 10*3/uL — ABNORMAL HIGH (ref 4.0–10.5)
nRBC: 0 % (ref 0.0–0.2)

## 2022-01-08 LAB — URINALYSIS, ROUTINE W REFLEX MICROSCOPIC
Bilirubin Urine: NEGATIVE
Glucose, UA: NEGATIVE mg/dL
Ketones, ur: NEGATIVE mg/dL
Leukocytes,Ua: NEGATIVE
Nitrite: NEGATIVE
Protein, ur: 30 mg/dL — AB
Specific Gravity, Urine: 1.027 (ref 1.005–1.030)
pH: 6 (ref 5.0–8.0)

## 2022-01-08 MED ORDER — FENTANYL CITRATE PF 50 MCG/ML IJ SOSY
100.0000 ug | PREFILLED_SYRINGE | Freq: Once | INTRAMUSCULAR | Status: AC
  Administered 2022-01-08: 100 ug via INTRAVENOUS
  Filled 2022-01-08: qty 2

## 2022-01-08 MED ORDER — OXYCODONE HCL 5 MG PO TABS: 5.0000 mg | ORAL_TABLET | Freq: Three times a day (TID) | ORAL | 0 refills | Status: AC | PRN

## 2022-01-08 MED ORDER — ONDANSETRON 4 MG PO TBDP: 4.0000 mg | ORAL_TABLET | Freq: Three times a day (TID) | ORAL | 0 refills | Status: AC | PRN

## 2022-01-08 MED ORDER — HYDROMORPHONE HCL 1 MG/ML IJ SOLN
1.0000 mg | Freq: Once | INTRAMUSCULAR | Status: AC
  Administered 2022-01-08: 1 mg via INTRAVENOUS
  Filled 2022-01-08: qty 1

## 2022-01-08 MED ORDER — ONDANSETRON HCL 4 MG/2ML IJ SOLN
4.0000 mg | Freq: Once | INTRAMUSCULAR | Status: AC
  Administered 2022-01-08: 4 mg via INTRAVENOUS
  Filled 2022-01-08: qty 2

## 2022-01-08 MED ORDER — KETOROLAC TROMETHAMINE 30 MG/ML IJ SOLN
15.0000 mg | Freq: Once | INTRAMUSCULAR | Status: AC
  Administered 2022-01-08: 15 mg via INTRAVENOUS
  Filled 2022-01-08: qty 1

## 2022-01-08 MED ORDER — SODIUM CHLORIDE 0.9 % IV BOLUS
1000.0000 mL | Freq: Once | INTRAVENOUS | Status: AC
  Administered 2022-01-08: 1000 mL via INTRAVENOUS

## 2022-01-08 MED ORDER — TAMSULOSIN HCL 0.4 MG PO CAPS: 0.4000 mg | ORAL_CAPSULE | Freq: Every day | ORAL | 0 refills | Status: AC

## 2022-01-08 MED ORDER — OXYCODONE-ACETAMINOPHEN 5-325 MG PO TABS
1.0000 | ORAL_TABLET | ORAL | Status: DC | PRN
  Administered 2022-01-08: 1 via ORAL
  Filled 2022-01-08: qty 1

## 2022-01-08 MED ORDER — CEPHALEXIN 500 MG PO CAPS: 500.0000 mg | ORAL_CAPSULE | Freq: Three times a day (TID) | ORAL | 0 refills | Status: AC

## 2022-01-08 MED ORDER — SODIUM CHLORIDE 0.9 % IV SOLN
1.0000 g | Freq: Once | INTRAVENOUS | Status: AC
  Administered 2022-01-08: 1 g via INTRAVENOUS
  Filled 2022-01-08: qty 10

## 2022-01-08 NOTE — Discharge Instructions (Signed)
Schedule an appointment with the urologist.  If symptoms change or worsen over the weekend and the medicine is not helping, return to the emergency department.

## 2022-01-08 NOTE — ED Provider Notes (Signed)
[facility] Provider Note    None    (approximate)   History   Flank Pain   HPI  Douglas Baldwin is a 41 y.o. male presents to the emergency department for treatment and evaluation of right flank pain has been present for the past 4 hours.  He has had similar symptoms in the past when he had a kidney stone but this has been several years ago.  Family member states that he had eaten [place] food just before the pain started.  No alleviating measures attempted prior to arrival.  Past Medical History:  Diagnosis Date   Calculus (=stone) 05/02/2013   Chronic prostatitis         Physical Exam   Triage Vital Signs: ED Triage Vitals  Enc Vitals Group     BP 01/08/22 1910 (!) 127/99     Pulse Rate 01/08/22 1910 87     Resp 01/08/22 1910 (!) 24     Temp 01/08/22 1910 98.4 F (36.9 C)     Temp Source 01/08/22 1910 Oral     SpO2 01/08/22 1910 98 %     Weight 01/08/22 1913 180 lb (81.6 kg)     Height 01/08/22 1913 5\' 7"  (1.702 m)     Head Circumference --      Peak Flow --      Pain Score 01/08/22 1911 10     Pain Loc --      Pain Edu? --      Excl. in GC? --     Most recent vital signs: Vitals:   01/08/22 1910 01/08/22 2100  BP: (!) 127/99 128/67  Pulse: 87 91  Resp: (!) 24 (!) 22  Temp: 98.4 F (36.9 C)   SpO2: 98% 100%    General: Awake, no distress.  Pale and actively vomiting. CV:  Good peripheral perfusion.  Resp:  Normal effort.  Abd:  No distention.  Other:  Right-sided CVA tenderness.   ED Results / Procedures / Treatments   Labs (all labs ordered are listed, but only abnormal results are displayed) Labs Reviewed  URINALYSIS, ROUTINE W REFLEX MICROSCOPIC - Abnormal; Notable for the following components:      Result Value   Color, Urine YELLOW (*)    APPearance HAZY (*)    Hgb urine dipstick MODERATE (*)    Protein, ur 30 (*)    Bacteria, UA RARE (*)    All other components within normal limits  COMPREHENSIVE  METABOLIC PANEL - Abnormal; Notable for the following components:   Potassium 3.3 (*)    Glucose, Bld 131 (*)    Total Protein 8.2 (*)    All other components within normal limits  CBC WITH DIFFERENTIAL/PLATELET - Abnormal; Notable for the following components:   WBC 15.5 (*)    Platelets 437 (*)    Neutro Abs 12.0 (*)    All other components within normal limits     EKG  Not indicated   RADIOLOGY  Punctate obstructing stone at the right UVJ with minimal right hydroureteronephrosis.  I have independently reviewed and interpreted imaging as well as reviewed report from radiology.  PROCEDURES:  Critical Care performed: No  Procedures   MEDICATIONS ORDERED IN ED:  Medications  oxyCODONE-acetaminophen (PERCOCET/ROXICET) 5-325 MG per tablet 1 tablet (1 tablet Oral Given 01/08/22 1917)  sodium chloride 0.9 % bolus 1,000 mL (0 mLs Intravenous Stopped 01/08/22 2121)  ondansetron (ZOFRAN) injection 4 mg (4 mg Intravenous Given 01/08/22 2021)  fentaNYL (SUBLIMAZE) injection 100 mcg (100 mcg Intravenous Given 01/08/22 2020)  HYDROmorphone (DILAUDID) injection 1 mg (1 mg Intravenous Given 01/08/22 2059)  cefTRIAXone (ROCEPHIN) 1 g in sodium chloride 0.9 % 100 mL IVPB (0 g Intravenous Stopped 01/08/22 2148)  HYDROmorphone (DILAUDID) injection 1 mg (1 mg Intravenous Given 01/08/22 2146)  ketorolac (TORADOL) 30 MG/ML injection 15 mg (15 mg Intravenous Given 01/08/22 2254)     IMPRESSION / MDM / ASSESSMENT AND PLAN / ED COURSE   I reviewed the triage vital signs and the nursing notes.  Differential diagnosis includes, but is not limited to: Kidney stone, pyelonephritis, acute on chronic prostatitis  Patient's presentation is most consistent with acute presentation with potential threat to life or bodily function.  41 year old male presenting to the emergency department for treatment and evaluation of right flank pain with nausea and vomiting.  See HPI for further details.  Fluids, labs, pain  medication, Zofran, and CT for renal stone study ordered.   Nausea well controlled after Zofran pain has not improved much at all.  Dilaudid 1 mg ordered.  CT for renal stone does show a punctate stone at the UVJ causing obstruction.  Video interpreter utilized to discuss results and plan with the patient.  He will receive a second liter of IV fluids.  He is also still complaining of severe pain and states that it has not gotten any better.  1 additional dose of Dilaudid ordered.  IV fluids continue to infuse but he still complains of pain.  It is unlikely that he will require lithotripsy so I will give him a dose of IV Toradol.  His oxygen saturation decreases into the high 80s when he falls asleep but he is easily awakened and saturation returns to normal.  Patient significantly improved after Toradol and states that he "feels better."  Oxygen saturations now remaining in the mid upper 90s and he is awake and alert.  Plan will be to discharge him home with prescriptions for pain medication, Zofran, and Flomax.  He was encouraged to call schedule follow-up appointment with urology.  If symptoms change or worsen over the weekend despite taking his medications he is to return to the emergency department.       FINAL CLINICAL IMPRESSION(S) / ED DIAGNOSES   Final diagnoses:  Kidney stone on right side     Rx / DC Orders   ED Discharge Orders          Ordered    tamsulosin (FLOMAX) 0.4 MG CAPS capsule  Daily        01/08/22 2325    oxyCODONE (ROXICODONE) 5 MG immediate release tablet  Every 8 hours PRN        01/08/22 2325    cephALEXin (KEFLEX) 500 MG capsule  3 times daily        01/08/22 2325    ondansetron (ZOFRAN-ODT) 4 MG disintegrating tablet  Every 8 hours PRN        01/08/22 2325             Note:  This document was prepared using Dragon voice recognition software and may include unintentional dictation errors.   Chinita Pester, FNP 01/08/22 2329    Sharman Cheek, MD 01/09/22 [phone]

## 2022-01-08 NOTE — ED Notes (Signed)
Pt back in room. Pt says pain is still 10/10, medication has done nothing. Provider aware

## 2022-01-08 NOTE — ED Triage Notes (Signed)
Pt presents via POV c/o right flank pain x3 hours. Reports hx of kidney stones.

## 2022-01-08 NOTE — ED Notes (Signed)
Patient transported to CT 

## 2023-10-07 ENCOUNTER — Ambulatory Visit
Admission: RE | Admit: 2023-10-07 | Discharge: 2023-10-07 | Disposition: A | Discharge status: Home / Self Care | Payer: BC Managed Care – PPO | Source: Ambulatory Visit | Attending: Family Medicine | Admitting: Family Medicine

## 2023-10-07 ENCOUNTER — Other Ambulatory Visit: Payer: Self-pay | Admitting: Family Medicine

## 2023-10-07 DIAGNOSIS — J479 Bronchiectasis, uncomplicated: Secondary | ICD-10-CM
